# Patient Record
Sex: Female | Born: 1990 | Race: Black or African American | Hispanic: No | Marital: Single | Smoking: Never smoker
Health system: Southern US, Community
[De-identification: ages and names within clinical notes are randomized; demographics above are authoritative.]

## PROBLEM LIST (undated history)

## (undated) ENCOUNTER — Emergency Department (HOSPITAL_BASED_OUTPATIENT_CLINIC_OR_DEPARTMENT_OTHER): Admission: EM | Payer: Medicaid Other | Source: Home / Self Care

## (undated) DIAGNOSIS — D571 Sickle-cell disease without crisis: Secondary | ICD-10-CM

## (undated) HISTORY — PX: INDUCED ABORTION: SHX677

## (undated) HISTORY — PX: WISDOM TOOTH EXTRACTION: SHX21

---
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
## 2013-05-14 ENCOUNTER — Emergency Department (HOSPITAL_BASED_OUTPATIENT_CLINIC_OR_DEPARTMENT_OTHER)
Admission: EM | Admit: 2013-05-14 | Discharge: 2013-05-14 | Disposition: A | Discharge status: Home / Self Care | Payer: Medicaid Other | Attending: Emergency Medicine | Admitting: Emergency Medicine

## 2013-05-14 ENCOUNTER — Emergency Department (HOSPITAL_BASED_OUTPATIENT_CLINIC_OR_DEPARTMENT_OTHER): Payer: Medicaid Other

## 2013-05-14 ENCOUNTER — Encounter (HOSPITAL_BASED_OUTPATIENT_CLINIC_OR_DEPARTMENT_OTHER): Payer: Self-pay | Admitting: Student

## 2013-05-14 DIAGNOSIS — J45901 Unspecified asthma with (acute) exacerbation: Secondary | ICD-10-CM | POA: Insufficient documentation

## 2013-05-14 DIAGNOSIS — R079 Chest pain, unspecified: Secondary | ICD-10-CM | POA: Insufficient documentation

## 2013-05-14 MED ORDER — ALBUTEROL SULFATE HFA 108 (90 BASE) MCG/ACT IN AERS
2.0000 | INHALATION_SPRAY | RESPIRATORY_TRACT | Status: DC | PRN
  Administered 2013-05-14: 2 via RESPIRATORY_TRACT
  Filled 2013-05-14: qty 6.7

## 2013-05-14 NOTE — ED Provider Notes (Signed)
CSN: [id number]     Arrival date & time 05/14/13  0818 History   First MD Initiated Contact with Patient 05/14/13 0831     Chief Complaint  Patient presents with  . Chest Pain   (Consider location/radiation/quality/duration/timing/severity/associated sxs/prior Treatment) HPI Comments: Patient presents with complaints of chest pain. She states that her asthma starts flaring up yesterday he. Her chest pain started shortly after that. She has an aching in the Center of her chest. Otherwise nonradiating. It hurts to cough and to move and breathe deeply. She denies any leg pain or swelling. She denies any productive cough. She denies any rhinorrhea. There's no fevers, nausea vomiting or diaphoresis. She has no palpitations. She does have a history of asthma but she's been out of her inhaler for a long time she says because she hasn't really had problems with her asthma. There's no family history of sudden cardiac death per the patient.  Patient is a 22 y.o. female presenting with chest pain.  Chest Pain Associated symptoms: cough and shortness of breath   Associated symptoms: no abdominal pain, no back pain, no diaphoresis, no dizziness, no fatigue, no fever, no headache, no nausea, no numbness, not vomiting and no weakness     Past Medical History  Diagnosis Date  . Asthma    History reviewed. No pertinent past surgical history. No family history on file. History  Substance Use Topics  . Smoking status: Never Smoker   . Smokeless tobacco: Not on file  . Alcohol Use: No   OB History   Grav Para Term Preterm Abortions TAB SAB Ect Mult Living                 Review of Systems  Constitutional: Negative for fever, chills, diaphoresis and fatigue.  HENT: Negative for congestion, rhinorrhea and sneezing.   Eyes: Negative.   Respiratory: Positive for cough, shortness of breath and wheezing. Negative for chest tightness.   Cardiovascular: Positive for chest pain. Negative for leg swelling.   Gastrointestinal: Negative for nausea, vomiting, abdominal pain, diarrhea and blood in stool.  Genitourinary: Negative for frequency, hematuria, flank pain and difficulty urinating.  Musculoskeletal: Negative for back pain and arthralgias.  Skin: Negative for rash.  Neurological: Negative for dizziness, speech difficulty, weakness, numbness and headaches.    Allergies  Review of patient's allergies indicates no known allergies.  Home Medications  No current outpatient prescriptions on file. BP 106/46  Pulse 76  Temp(Src) 98.5 F (36.9 C) (Oral)  Resp 20  Wt 95 lb (43.092 kg)  BMI 18.55 kg/m2  SpO2 100% Physical Exam  Constitutional: She is oriented to person, place, and time. She appears well-developed and well-nourished.  HENT:  Head: Normocephalic and atraumatic.  Mouth/Throat: Oropharynx is clear and moist.  Eyes: Pupils are equal, round, and reactive to light.  Neck: Normal range of motion. Neck supple.  Cardiovascular: Normal rate, regular rhythm and normal heart sounds.   Pulmonary/Chest: Effort normal and breath sounds normal. No respiratory distress. She has no wheezes. She has no rales. She exhibits tenderness (reproducible tenderness over the sternum).  Abdominal: Soft. Bowel sounds are normal. There is no tenderness. There is no rebound and no guarding.  Musculoskeletal: Normal range of motion. She exhibits no edema.  No calf edema  Lymphadenopathy:    She has no cervical adenopathy.  Neurological: She is alert and oriented to person, place, and time.  Skin: Skin is warm and dry. No rash noted.  Psychiatric: She has a normal  mood and affect.    ED Course  Procedures (including critical care time) Labs Review Labs Reviewed - No data to display Imaging Review Dg Chest 2 View  05/14/2013   *RADIOLOGY REPORT*  Clinical Data: Chest pain, shortness of breath.  CHEST - 2 VIEW  Comparison: None.  Findings: Cardiomediastinal silhouette appears normal.  No acute  pulmonary disease is noted.  Bony thorax is intact.  IMPRESSION: No acute cardiopulmonary abnormality seen.   Original Report Authenticated By: Lupita Raider.,  M.D.     Date: 05/14/2013  Rate: 74  Rhythm: normal sinus rhythm  QRS Axis: normal  Intervals: normal  ST/T Wave abnormalities: normal  Conduction Disutrbances:none  Narrative Interpretation:   Old EKG Reviewed: none available  Dg Chest 2 View  05/14/2013   *RADIOLOGY REPORT*  Clinical Data: Chest pain, shortness of breath.  CHEST - 2 VIEW  Comparison: None.  Findings: Cardiomediastinal silhouette appears normal.  No acute pulmonary disease is noted.  Bony thorax is intact.  IMPRESSION: No acute cardiopulmonary abnormality seen.   Original Report Authenticated By: Lupita Raider.,  M.D.      MDM   1. Chest pain     Patient has centralized chest pain. It's worse with palpation the chest wall. She currently has new shortness of breath, persistent tachycardia or other symptoms concerning for PE. There is no evidence of pneumonia or pneumothorax. She was discharged home in good condition. She was dispensed an albuterol MDI to use at home. She was advised to followup with her primary care physician for reevaluation within a week.    Rolan Bucco, MD 05/14/13 [phone]

## 2013-05-14 NOTE — ED Notes (Signed)
Pt reports central CP s/p asthma attacks x 2 days. No medication at home.

## 2013-08-13 ENCOUNTER — Emergency Department (HOSPITAL_BASED_OUTPATIENT_CLINIC_OR_DEPARTMENT_OTHER)
Admission: EM | Admit: 2013-08-13 | Discharge: 2013-08-13 | Disposition: A | Discharge status: Home / Self Care | Payer: Medicaid Other | Attending: Emergency Medicine | Admitting: Emergency Medicine

## 2013-08-13 ENCOUNTER — Encounter (HOSPITAL_BASED_OUTPATIENT_CLINIC_OR_DEPARTMENT_OTHER): Payer: Self-pay | Admitting: Emergency Medicine

## 2013-08-13 DIAGNOSIS — M79645 Pain in left finger(s): Secondary | ICD-10-CM

## 2013-08-13 DIAGNOSIS — S6980XA Other specified injuries of unspecified wrist, hand and finger(s), initial encounter: Secondary | ICD-10-CM | POA: Insufficient documentation

## 2013-08-13 DIAGNOSIS — J45909 Unspecified asthma, uncomplicated: Secondary | ICD-10-CM | POA: Insufficient documentation

## 2013-08-13 DIAGNOSIS — X58XXXA Exposure to other specified factors, initial encounter: Secondary | ICD-10-CM | POA: Insufficient documentation

## 2013-08-13 DIAGNOSIS — Y939 Activity, unspecified: Secondary | ICD-10-CM | POA: Insufficient documentation

## 2013-08-13 DIAGNOSIS — Y929 Unspecified place or not applicable: Secondary | ICD-10-CM | POA: Insufficient documentation

## 2013-08-13 DIAGNOSIS — Z79899 Other long term (current) drug therapy: Secondary | ICD-10-CM | POA: Insufficient documentation

## 2013-08-13 DIAGNOSIS — S6990XA Unspecified injury of unspecified wrist, hand and finger(s), initial encounter: Secondary | ICD-10-CM | POA: Insufficient documentation

## 2013-08-13 MED ORDER — ACETAMINOPHEN 325 MG PO TABS
650.0000 mg | ORAL_TABLET | Freq: Once | ORAL | Status: AC
  Administered 2013-08-13: 650 mg via ORAL

## 2013-08-13 MED ORDER — ACETAMINOPHEN 325 MG PO TABS
ORAL_TABLET | ORAL | Status: AC
  Filled 2013-08-13: qty 2

## 2013-08-13 NOTE — ED Notes (Signed)
Fingernail broke off left ring finger, pt wants to make sure there is no  infection, no bleeding

## 2013-08-13 NOTE — ED Provider Notes (Signed)
CSN: [id number]     Arrival date & time 08/13/13  0229 History   First MD Initiated Contact with Patient 08/13/13 [phone]     Chief Complaint  Patient presents with  . Finger Injury   (Consider location/radiation/quality/duration/timing/severity/associated sxs/prior Treatment) Patient is a 22 y.o. female presenting with hand pain. The history is provided by the patient. No language interpreter was used.  Hand Pain This is a new problem. The current episode started 3 to 5 hours ago. The problem occurs constantly. The problem has not changed since onset.Nothing aggravates the symptoms. Nothing relieves the symptoms. She has tried nothing for the symptoms. The treatment provided no relief.  Broke off an acrylic finger nail tip that was on the left ring finger approximately 3 hours ago.  Patient wanted to be checked for infection at the site.    Past Medical History  Diagnosis Date  . Asthma    History reviewed. No pertinent past surgical history. History reviewed. No pertinent family history. History  Substance Use Topics  . Smoking status: Never Smoker   . Smokeless tobacco: Not on file  . Alcohol Use: No   OB History   Grav Para Term Preterm Abortions TAB SAB Ect Mult Living                 Review of Systems  All other systems reviewed and are negative.    Allergies  Review of patient's allergies indicates no known allergies.  Home Medications   Current Outpatient Rx  Name  Route  Sig  Dispense  Refill  . albuterol (PROVENTIL HFA;VENTOLIN HFA) 108 (90 BASE) MCG/ACT inhaler   Inhalation   Inhale into the lungs every 6 (six) hours as needed for wheezing or shortness of breath.          BP 132/76  Pulse 78  Temp(Src) 99.2 F (37.3 C) (Oral)  Resp 16  Ht 5' (1.524 m)  Wt 96 lb (43.545 kg)  BMI 18.75 kg/m2  SpO2 100% Physical Exam  Constitutional: She is oriented to person, place, and time. She appears well-developed and well-nourished. No distress.  HENT:  Head:  Normocephalic and atraumatic.  Eyes: EOM are normal.  Neck: Normal range of motion. Neck supple.  Cardiovascular: Normal rate, regular rhythm and intact distal pulses.   Pulmonary/Chest: Effort normal and breath sounds normal.  Abdominal: Soft. Bowel sounds are normal. There is no tenderness.  Musculoskeletal: Normal range of motion.       Hands: Broken off, no nailbed injury no swelling no paronychia  Neurological: She is alert and oriented to person, place, and time.  Skin: Skin is warm and dry.  Psychiatric: She has a normal mood and affect.    ED Course  Procedures (including critical care time) Labs Review Labs Reviewed - No data to display Imaging Review No results found.  EKG Interpretation   None       MDM  No diagnosis found. bandaid placed.  Patient counseled to keep the area clean and dry.  Apply neosporin and cover and take tylenol for pain.  Patient given first dose of tylenol in the ED   Jery Hollern K Jaiceon Collister-Rasch, MD 08/13/13 [phone]

## 2013-09-16 ENCOUNTER — Encounter (HOSPITAL_BASED_OUTPATIENT_CLINIC_OR_DEPARTMENT_OTHER): Payer: Self-pay | Admitting: Emergency Medicine

## 2013-09-16 ENCOUNTER — Emergency Department (HOSPITAL_BASED_OUTPATIENT_CLINIC_OR_DEPARTMENT_OTHER)
Admission: EM | Admit: 2013-09-16 | Discharge: 2013-09-16 | Disposition: A | Discharge status: Home / Self Care | Payer: Medicaid Other | Attending: Emergency Medicine | Admitting: Emergency Medicine

## 2013-09-16 ENCOUNTER — Emergency Department (HOSPITAL_BASED_OUTPATIENT_CLINIC_OR_DEPARTMENT_OTHER): Payer: Medicaid Other

## 2013-09-16 DIAGNOSIS — Z79899 Other long term (current) drug therapy: Secondary | ICD-10-CM | POA: Insufficient documentation

## 2013-09-16 DIAGNOSIS — J45909 Unspecified asthma, uncomplicated: Secondary | ICD-10-CM | POA: Insufficient documentation

## 2013-09-16 DIAGNOSIS — S63509A Unspecified sprain of unspecified wrist, initial encounter: Secondary | ICD-10-CM | POA: Insufficient documentation

## 2013-09-16 DIAGNOSIS — X500XXA Overexertion from strenuous movement or load, initial encounter: Secondary | ICD-10-CM | POA: Insufficient documentation

## 2013-09-16 DIAGNOSIS — Y9389 Activity, other specified: Secondary | ICD-10-CM | POA: Insufficient documentation

## 2013-09-16 DIAGNOSIS — S63502A Unspecified sprain of left wrist, initial encounter: Secondary | ICD-10-CM

## 2013-09-16 DIAGNOSIS — Y929 Unspecified place or not applicable: Secondary | ICD-10-CM | POA: Insufficient documentation

## 2013-09-16 MED ORDER — NAPROXEN 250 MG PO TABS
500.0000 mg | ORAL_TABLET | Freq: Once | ORAL | Status: AC
  Administered 2013-09-16: 500 mg via ORAL
  Filled 2013-09-16: qty 2

## 2013-09-16 MED ORDER — NAPROXEN 500 MG PO TABS: 500.0000 mg | ORAL_TABLET | Freq: Two times a day (BID) | ORAL | Status: DC

## 2013-09-16 NOTE — ED Notes (Signed)
2 hours ago began having pain in left wrist and forearm area thinks she may have hit it on something but doesn't know for sure

## 2013-09-16 NOTE — Discharge Instructions (Signed)
Sprain A sprain is an injury to the soft tissue that connects adjacent bones across a joint (ligament), in which the ligament becomes stretched or torn. The purpose of ligaments is to prevent a joint from moving out side of its intended range of motion. The most common joints of the body to suffer from a sprain are the ankles, knees, and fingers. Sprains are classified into 3 categories: grade 1, grade 2, and grade 3. Grade 1 sprains cause pain, but the tendon is not lengthened. Grade 2 sprains include a lengthened ligament due to the ligament being stretched or partially ruptured. With grade 2 sprains there is still function, although the function may be diminished. Grade 3 sprains are marked by a complete tear of the ligament and the joint usually displays a loss of function.  SYMPTOMS   Pain and tenderness in the area of injury; severity varies with extent of injury.  Swelling of the affected joint (usually).  Redness or bruising in the area of injury, either immediately or several hours after the injury.  Loss of normal mobility of the injured joint. CAUSES  A sprain may occur as a secondary injury to a traumatic event, such as a fall or twisting injury. The ankle is susceptible to sprains because of it is a mechanically weak joint and is exposed during athletic events. RISK INCREASES WITH:  Trauma, especially with high-risk activities, such as sports with a lot of jumping, for knee and ankle sprains (basketball or volleyball); sports with a lot of pivoting motions, for knee sprains (skiing, soccer, or football); and contact sports.  Falls onto outstretched hands and wrists (wrist sprains).  Catching sports, such as water polo and baseball (finger sprains).  Poorly fitting and high-heeled shoes.  Poor field conditions.  Poor strength and flexibility.  Failure to warm-up properly before activity. PREVENTION  Warm up and stretch properly before activity.  Maintain physical  fitness:  Muscle strength.  Endurance and flexibility.  Cardiovascular fitness.  Wear properly fitted and padded protective equipment.  Wrap weak joints with support bandages before strenuous activity. PROGNOSIS  If treated properly, sprains usually heal in 2 to 8 weeks. Occasionally sprains require surgery for healing to occur. RELATED COMPLICATIONS  Permanent instability of a joint if the sprain is severe or if a ligament is repeatedly sprained.  Arthritis of the joint. TREATMENT Treatment involves ice and medicine to relieve pain and inflammation. Rest and immobilization of the injured joint is necessary for healing to occur. Strengthening and stretching exercises may be recommended after immobilization to regain strength and a full range of motion. For severe sprains surgery may be necessary to repair the injured ligament. MEDICATION  If pain medicine is necessary, then nonsteroidal anti-inflammatory medicines, such as aspirin and ibuprofen, or other minor pain relievers, such as acetaminophen, are often recommended.  Do not take pain medicine for 7 days before surgery.  Prescription pain relievers may be prescribed. Use only as directed and only as much as you need.  Cortisone injections are generally not advised for sprains. Cortisone may affect the healing of the ligament. HEAT AND COLD  Cold treatment (icing) relieves pain and reduces inflammation. Cold treatment should be applied for 10 to 15 minutes every 2 to 3 hours for inflammation and pain and immediately after any activity that aggravates your symptoms. Use ice packs or massage the area with a piece of ice (ice massage).  Heat treatment may be used prior to performing the stretching and strengthening activities prescribed by your   caregiver, physical therapist, or athletic trainer. Use a heat pack or soak the injury in warm water. SEEK MEDICAL CARE IF:  Symptoms get worse or do not improve in 2 to 6 weeks despite  treatment. Document Released: 08/21/2005 Document Revised: 11/13/2011 Document Reviewed: 12/03/2008 [employer] Patient Information 2014 [employer].  

## 2013-09-16 NOTE — ED Provider Notes (Signed)
CSN: [id number]     Arrival date & time 09/16/13  1635 History   First MD Initiated Contact with Patient 09/16/13 1659     Chief Complaint  Patient presents with  . wrist and forearm pain     HPI Patient states she was moving some objects out of the car and she started having pain in her left wrist. She's not sure she hit it on something or if she injured it. Since that time she's had persistent pain in the left wrist. It increases with movement and palpation. She denies any numbness or weakness. She denies any other injuries. She generally does not have any pain or discomfort in her left wrist.  No elbow pain. No pain in her hand or fingers. Past Medical History  Diagnosis Date  . Asthma    History reviewed. No pertinent past surgical history. History reviewed. No pertinent family history. History  Substance Use Topics  . Smoking status: Never Smoker   . Smokeless tobacco: Not on file  . Alcohol Use: No   OB History   Grav Para Term Preterm Abortions TAB SAB Ect Mult Living                 Review of Systems  All other systems reviewed and are negative.    Allergies  Review of patient's allergies indicates no known allergies.  Home Medications   Current Outpatient Rx  Name  Route  Sig  Dispense  Refill  . albuterol (PROVENTIL HFA;VENTOLIN HFA) 108 (90 BASE) MCG/ACT inhaler   Inhalation   Inhale into the lungs every 6 (six) hours as needed for wheezing or shortness of breath.         . naproxen (NAPROSYN) 500 MG tablet   Oral   Take 1 tablet (500 mg total) by mouth 2 (two) times daily.   30 tablet   0    BP 125/73  Temp(Src) 98.5 F (36.9 C)  Resp 18  Ht 5' (1.524 m)  Wt 97 lb (43.999 kg)  BMI 18.94 kg/m2  SpO2 99% Physical Exam  Nursing note and vitals reviewed. Constitutional: She appears well-developed and well-nourished. No distress.  HENT:  Head: Normocephalic and atraumatic.  Right Ear: External ear normal.  Left Ear: External ear normal.   Eyes: Conjunctivae are normal. Right eye exhibits no discharge. Left eye exhibits no discharge. No scleral icterus.  Neck: Neck supple. No tracheal deviation present.  Cardiovascular: Normal rate.   Pulmonary/Chest: Effort normal. No stridor. No respiratory distress.  Musculoskeletal: She exhibits tenderness. She exhibits no edema.       Left elbow: Normal.       Left wrist: She exhibits tenderness and bony tenderness. She exhibits no swelling, no effusion, no crepitus and no deformity.       Left forearm: Normal.       Left hand: Normal.  Neurological: She is alert. Cranial nerve deficit: no gross deficits.  Skin: Skin is warm and dry. No rash noted.  Psychiatric: She has a normal mood and affect.    ED Course  Procedures (including critical care time) Labs Review Labs Reviewed - No data to display Imaging Review Dg Wrist Complete Left  09/16/2013   CLINICAL DATA:  Wrist pain for 2 hr  EXAM: LEFT WRIST - COMPLETE 3+ VIEW  COMPARISON:  None.  FINDINGS: There is no evidence of fracture or dislocation. There is no evidence of arthropathy or other focal bone abnormality. Soft tissues are unremarkable.  IMPRESSION: No  acute abnormality noted.   Electronically Signed   By: Alcide CleverMark  Lukens M.D.   On: 09/16/2013 17:28    EKG Interpretation   None       MDM   1. Left wrist sprain    No sign of infection on exam.  Normal pulses.  Xrays normal.  Possible ligamentous injury.  Rest, ice , splint,  Nsaids.  Follow up ortho prn    Celene KrasJon R Kenneith Stief, MD 09/16/13 [phone]

## 2014-01-07 ENCOUNTER — Emergency Department (HOSPITAL_BASED_OUTPATIENT_CLINIC_OR_DEPARTMENT_OTHER)
Admission: EM | Admit: 2014-01-07 | Discharge: 2014-01-07 | Disposition: A | Discharge status: Home / Self Care | Payer: Medicaid Other | Attending: Emergency Medicine | Admitting: Emergency Medicine

## 2014-01-07 ENCOUNTER — Encounter (HOSPITAL_BASED_OUTPATIENT_CLINIC_OR_DEPARTMENT_OTHER): Payer: Self-pay | Admitting: Emergency Medicine

## 2014-01-07 ENCOUNTER — Emergency Department (HOSPITAL_BASED_OUTPATIENT_CLINIC_OR_DEPARTMENT_OTHER): Payer: Medicaid Other

## 2014-01-07 DIAGNOSIS — IMO0002 Reserved for concepts with insufficient information to code with codable children: Secondary | ICD-10-CM | POA: Insufficient documentation

## 2014-01-07 DIAGNOSIS — J45909 Unspecified asthma, uncomplicated: Secondary | ICD-10-CM | POA: Insufficient documentation

## 2014-01-07 DIAGNOSIS — Y929 Unspecified place or not applicable: Secondary | ICD-10-CM | POA: Insufficient documentation

## 2014-01-07 DIAGNOSIS — Y9301 Activity, walking, marching and hiking: Secondary | ICD-10-CM | POA: Insufficient documentation

## 2014-01-07 DIAGNOSIS — S90129A Contusion of unspecified lesser toe(s) without damage to nail, initial encounter: Secondary | ICD-10-CM

## 2014-01-07 DIAGNOSIS — Z79899 Other long term (current) drug therapy: Secondary | ICD-10-CM | POA: Insufficient documentation

## 2014-01-07 MED ORDER — NAPROXEN 500 MG PO TABS
500.0000 mg | ORAL_TABLET | Freq: Two times a day (BID) | ORAL | Status: DC
Start: 2014-01-07 — End: 2014-03-21

## 2014-01-07 NOTE — ED Notes (Signed)
Pain and discoloration to left 2nd toe today while walking.  No known injury.

## 2014-01-07 NOTE — Discharge Instructions (Signed)

## 2014-01-07 NOTE — ED Provider Notes (Signed)
CSN: [id number]     Arrival date & time 01/07/14  1750 History  This chart was scribed for Alexis Peters Mekala Winger, MD by Beverly MilchJ Harrison Collins, ED Scribe. This patient was seen in room MH05/[facility]05 and the patient's care was started at 6:18 PM.    Chief Complaint  Patient presents with  . Toe Pain    The history is provided by the patient. No language interpreter was used.  HPI Comments: Alexis Peters is a 23 y.o. female who presents to the Emergency Department complaining of gradual onset left toe pain that began today about four hours ago while she was walking. Pt denies any kind of trauma to left foot. She states she had a surgery yesterday for her wisdom tooth and is unsure if perhaps she hit it without knowing while on pain medication. Pt denies taking any medicine for her toe pain. She states her pain worsens with movement of her toes and walking. Pt denies fever, chills, nausea, vomiting, and diarrhea.  Past Medical History  Diagnosis Date  . Asthma     Past Surgical History  Procedure Laterality Date  . Wisdom tooth extraction      No family history on file. History  Substance Use Topics  . Smoking status: Never Smoker   . Smokeless tobacco: Not on file  . Alcohol Use: No    Review of Systems  Musculoskeletal: Positive for arthralgias.  A complete 10 system review of systems was obtained and all systems are negative except as noted in the HPI and PMH.    Allergies  Review of patient's allergies indicates no known allergies.   Home Medications   Prior to Admission medications   Medication Sig Start Date End Date Taking? Authorizing Provider  albuterol (PROVENTIL HFA;VENTOLIN HFA) 108 (90 BASE) MCG/ACT inhaler Inhale into the lungs every 6 (six) hours as needed for wheezing or shortness of breath.    Historical Provider, MD  naproxen (NAPROSYN) 500 MG tablet Take 1 tablet (500 mg total) by mouth 2 (two) times daily. 09/16/13   Alexis Peters Alexis Dossantos, MD    Triage Vitals: BP 122/71  Pulse 90   Temp(Src) 99.3 F (37.4 C) (Oral)  Resp 16  Ht 5' (1.524 m)  Wt 97 lb (43.999 kg)  BMI 18.94 kg/m2   Physical Exam  Nursing note and vitals reviewed. Constitutional: She appears well-developed and well-nourished. No distress.  HENT:  Head: Normocephalic and atraumatic.  Right Ear: External ear normal.  Left Ear: External ear normal.  Eyes: Conjunctivae are normal. Right eye exhibits no discharge. Left eye exhibits no discharge. No scleral icterus.  Neck: Neck supple. No tracheal deviation present.  Cardiovascular: Normal rate.   Pulmonary/Chest: Effort normal. No stridor. No respiratory distress.  Musculoskeletal: She exhibits tenderness (Mild tenderness to palpation of distal DIP joint of 2nd toe, some ecchymosis, no subungual hematoma, no deformity). She exhibits no edema.  Neurological: She is alert. Cranial nerve deficit: no gross deficits.  Skin: Skin is warm and dry. No rash noted.  Psychiatric: She has a normal mood and affect.    ED Course  Procedures (including critical care time)   COORDINATION OF CARE: 6:20 PM- Pt advised of plan for treatment and pt agrees.     Imaging Review Dg Toe 2nd Left  01/07/2014   CLINICAL DATA:  Pain and bruising.  EXAM: LEFT SECOND TOE  COMPARISON:  None.  FINDINGS: The joint spaces are maintained.  No acute fracture is identified.  IMPRESSION: No acute bony  findings.   Electronically Signed   By: Loralie ChampagneMark  Gallerani M.D.   On: 01/07/2014 18:34      MDM   Final diagnoses:  Bruised toe    Exam is most consistent with a contusion.  Doubt infection.  Nl cap refill, no cyanosis.  Pt does not recall injury but she did have recent dental surgery.  It is possible she does not remember stubbing her toe.   Alexis Peters Yasir Kitner, MD 01/07/14 [phone]

## 2014-03-21 ENCOUNTER — Encounter (HOSPITAL_BASED_OUTPATIENT_CLINIC_OR_DEPARTMENT_OTHER): Payer: Self-pay | Admitting: Emergency Medicine

## 2014-03-21 ENCOUNTER — Emergency Department (HOSPITAL_BASED_OUTPATIENT_CLINIC_OR_DEPARTMENT_OTHER)
Admission: EM | Admit: 2014-03-21 | Discharge: 2014-03-21 | Disposition: A | Discharge status: Home / Self Care | Payer: Medicaid Other | Attending: Emergency Medicine | Admitting: Emergency Medicine

## 2014-03-21 DIAGNOSIS — R1013 Epigastric pain: Secondary | ICD-10-CM

## 2014-03-21 DIAGNOSIS — J45909 Unspecified asthma, uncomplicated: Secondary | ICD-10-CM | POA: Insufficient documentation

## 2014-03-21 DIAGNOSIS — K3189 Other diseases of stomach and duodenum: Secondary | ICD-10-CM | POA: Insufficient documentation

## 2014-03-21 DIAGNOSIS — Z79899 Other long term (current) drug therapy: Secondary | ICD-10-CM | POA: Insufficient documentation

## 2014-03-21 LAB — COMPREHENSIVE METABOLIC PANEL
ALBUMIN: 4.3 g/dL (ref 3.5–5.2)
ALK PHOS: 72 U/L (ref 39–117)
ALT: 5 U/L (ref 0–35)
ANION GAP: 14 (ref 5–15)
AST: 15 U/L (ref 0–37)
BILIRUBIN TOTAL: 0.6 mg/dL (ref 0.3–1.2)
BUN: 6 mg/dL (ref 6–23)
CHLORIDE: 106 meq/L (ref 96–112)
CO2: 22 mEq/L (ref 19–32)
Calcium: 9.6 mg/dL (ref 8.4–10.5)
Creatinine, Ser: 1 mg/dL (ref 0.50–1.10)
GFR calc non Af Amer: 80 mL/min — ABNORMAL LOW (ref >90)
GLUCOSE: 69 mg/dL — AB (ref 70–99)
POTASSIUM: 3.6 meq/L — AB (ref 3.7–5.3)
SODIUM: 142 meq/L (ref 137–147)
Total Protein: 7.4 g/dL (ref 6.0–8.3)

## 2014-03-21 LAB — CBC WITH DIFFERENTIAL/PLATELET
Basophils Absolute: 0.1 10*3/uL (ref 0.0–0.1)
Basophils Relative: 1 % (ref 0–1)
Eosinophils Absolute: 0 10*3/uL (ref 0.0–0.7)
Eosinophils Relative: 0 % (ref 0–5)
HCT: 39.3 % (ref 36.0–46.0)
HEMOGLOBIN: 13.9 g/dL (ref 12.0–15.0)
LYMPHS ABS: 2.3 10*3/uL (ref 0.7–4.0)
Lymphocytes Relative: 26 % (ref 12–46)
MCH: 28 pg (ref 26.0–34.0)
MCHC: 35.4 g/dL (ref 30.0–36.0)
MCV: 79.2 fL (ref 78.0–100.0)
MONOS PCT: 8 % (ref 3–12)
Monocytes Absolute: 0.7 10*3/uL (ref 0.1–1.0)
NEUTROS ABS: 5.7 10*3/uL (ref 1.7–7.7)
NEUTROS PCT: 66 % (ref 43–77)
PLATELETS: 288 10*3/uL (ref 150–400)
RBC: 4.96 MIL/uL (ref 3.87–5.11)
RDW: 12.1 % (ref 11.5–15.5)
WBC: 8.7 10*3/uL (ref 4.0–10.5)

## 2014-03-21 LAB — LIPASE, BLOOD: Lipase: 20 U/L (ref 11–59)

## 2014-03-21 MED ORDER — OMEPRAZOLE 20 MG PO CPDR: 20.0000 mg | DELAYED_RELEASE_CAPSULE | Freq: Every day | ORAL | Status: DC

## 2014-03-21 MED ORDER — GI COCKTAIL ~~LOC~~
30.0000 mL | Freq: Once | ORAL | Status: AC
  Administered 2014-03-21: 30 mL via ORAL
  Filled 2014-03-21: qty 30

## 2014-03-21 NOTE — ED Provider Notes (Signed)
CSN: [id number]     Arrival date & time 03/21/14  1638 History   First MD Initiated Contact with Patient 03/21/14 1728     Chief Complaint  Patient presents with  . Nausea  . Abscess     (Consider location/radiation/quality/duration/timing/severity/associated sxs/prior Treatment) Patient is a 23 y.o. female presenting with abdominal pain. The history is provided by the patient. No language interpreter was used.  Abdominal Pain Pain location:  Generalized Associated symptoms: no chest pain, no constipation, no fever, no nausea, no shortness of breath and no vomiting   Associated symptoms comment:  Generalized abdominal pain that started yesterday and kept her up through the night. No nausea or vomiting. No fever. She denies constipation or blood per rectum. She denies regular use of NSAID medications.    Past Medical History  Diagnosis Date  . Asthma    Past Surgical History  Procedure Laterality Date  . Wisdom tooth extraction     No family history on file. History  Substance Use Topics  . Smoking status: Never Smoker   . Smokeless tobacco: Not on file  . Alcohol Use: No   OB History   Grav Para Term Preterm Abortions TAB SAB Ect Mult Living                 Review of Systems  Constitutional: Negative for fever.  Respiratory: Negative for shortness of breath.   Cardiovascular: Negative for chest pain.  Gastrointestinal: Positive for abdominal pain. Negative for nausea, vomiting, constipation and blood in stool.  Musculoskeletal: Negative for myalgias.  Skin:       She complains of a painless knot where she received a Depo injection previously.      Allergies  Review of patient's allergies indicates no known allergies.  Home Medications   Prior to Admission medications   Medication Sig Start Date End Date Taking? Authorizing Provider  albuterol (PROVENTIL HFA;VENTOLIN HFA) 108 (90 BASE) MCG/ACT inhaler Inhale into the lungs every 6 (six) hours as needed for  wheezing or shortness of breath.    Historical Provider, MD   BP 130/70  Pulse 88  Temp(Src) 98.5 F (36.9 C) (Oral)  Resp 18  Ht 5' (1.524 m)  Wt 95 lb (43.092 kg)  BMI 18.55 kg/m2  SpO2 100% Physical Exam  Constitutional: She appears well-developed and well-nourished. No distress.  Abdominal: Soft.  Epigastric tenderness without guarding. Soft, nondistended abdomen with normoactive bowel sounds.   Skin: Skin is warm and dry.  Palpable, non-tender, mobile and firm nodule in left buttock at reported previous injection site.     ED Course  Procedures (including critical care time) Labs Review Labs Reviewed  COMPREHENSIVE METABOLIC PANEL - Abnormal; Notable for the following:    Potassium 3.6 (*)    Glucose, Bld 69 (*)    GFR calc non Af Amer 80 (*)    All other components within normal limits  CBC WITH DIFFERENTIAL  LIPASE, BLOOD    Imaging Review No results found.   EKG Interpretation None      MDM   Final diagnoses:  None    1. Abdominal pain  No fever, no lab abnormalities and pain resolved with GI Cocktail - doubt acute process, suspect mild gastritis or dyspepsia. Recommend Prilosec. Nodule on left buttock likely scarring from previous infection. Doubt acute process as last injection was months ago.    Arnoldo HookerShari A Ysidra Sopher, PA-C 03/21/14 1920

## 2014-03-21 NOTE — Discharge Instructions (Signed)
Indigestion °Indigestion is discomfort in the upper abdomen that is caused by underlying problems such as gastroesophageal reflux disease (GERD), ulcers, or gallbladder problems.  °CAUSES  °Indigestion can be caused by many things. Possible causes include: °· Stomach acid in the esophagus. °· Stomach infections, usually caused by the bacteria H. pylori. °· Being overweight. °· Hiatal hernia. This means part of the stomach pushes up through the diaphragm. °· Overeating. °· Emotional problems, such as stress, anxiety, or depression. °· Poor nutrition. °· Consuming too much alcohol, tobacco, or caffeine. °· Consuming spicy foods, fats, peppermint, chocolate, tomato products, citrus, or fruit juices. °· Medicines such as aspirin and other anti-inflammatory drugs, hormones, steroids, and thyroid medicines. °· Gastroparesis. This is a condition in which the stomach does not empty properly. °· Stomach cancer. °· Pregnancy, due to an increase in hormone levels, a relaxation of muscles in the digestive tract, and pressure on the stomach from the growing fetus. °SYMPTOMS  °· Uncomfortable feeling of fullness after eating. °· Pain or burning sensation in the upper abdomen. °· Bloating. °· Belching and gas. °· Nausea and vomiting. °· Acidic taste in the mouth. °· Burning sensation in the chest (heartburn). °DIAGNOSIS  °Your caregiver will review your medical history and perform a physical exam. Other tests, such as blood tests, stool tests, X-rays, and other imaging scans, may be done to check for more serious problems. °TREATMENT  °Liquid antacids and other drugs may be given to block stomach acid secretion. Medicines that increase esophageal muscle tone may also be given to help reduce symptoms. If an infection is found, antibiotic medicine may be given. °HOME CARE INSTRUCTIONS °· Avoid foods and drinks that make your symptoms worse, such as: °¨ Caffeine or alcoholic drinks. °¨ Chocolate. °¨ Peppermint or mint  flavorings. °¨ Garlic and onions. °¨ Spicy foods. °¨ Citrus fruits, such as oranges, lemons, or limes. °¨ Tomato-based foods such as sauce, chili, salsa, and pizza. °¨ Fried and fatty foods. °· Avoid eating for the 3 hours prior to your bedtime. °· Eat small, frequent meals instead of large meals. °· Stop smoking if you smoke. °· Maintain a healthy weight. °· Wear loose-fitting clothing. Do not wear anything tight around your waist that causes pressure on your stomach. °· Raise the head of your bed 4 to 8 inches with wood blocks to help you sleep. Extra pillows will not help. °· Only take over-the-counter or prescription medicines as directed by your caregiver. °· Do not take aspirin, ibuprofen, or other nonsteroidal anti-inflammatory drugs (NSAIDs). °SEEK IMMEDIATE MEDICAL CARE IF:  °· You are not better after 2 days. °· You have chest pressure or pain that radiates up into your neck, arms, back, jaw, or upper abdomen. °· You have difficulty swallowing. °· You keep vomiting. °· You have black or bloody stools. °· You have a fever. °· You have dizziness, fainting, difficulty breathing, or heavy sweating. °· You have severe abdominal pain. °· You lose weight without trying. °MAKE SURE YOU: °· Understand these instructions. °· Will watch your condition. °· Will get help right away if you are not doing well or get worse. °Document Released: 09/28/2004 Document Revised: 11/13/2011 Document Reviewed: 04/05/2011 °ExitCare® Patient Information ©2015 [employer]. This information is not intended to replace advice given to you by your health care provider. Make sure you discuss any questions you have with your health care provider. ° °

## 2014-03-21 NOTE — ED Provider Notes (Signed)
Medical screening examination/treatment/procedure(s) were performed by non-physician practitioner and as supervising physician I was immediately available for consultation/collaboration.   EKG Interpretation None        Kristen N Ward, DO 03/21/14 2318 

## 2014-03-21 NOTE — ED Notes (Signed)
Reports nausea with no vomiting. Also sts abscess to left buttock

## 2014-07-26 ENCOUNTER — Encounter (HOSPITAL_BASED_OUTPATIENT_CLINIC_OR_DEPARTMENT_OTHER): Payer: Self-pay

## 2014-07-26 ENCOUNTER — Emergency Department (HOSPITAL_BASED_OUTPATIENT_CLINIC_OR_DEPARTMENT_OTHER)
Admission: EM | Admit: 2014-07-26 | Discharge: 2014-07-26 | Disposition: A | Discharge status: Home / Self Care | Payer: No Typology Code available for payment source | Attending: Emergency Medicine | Admitting: Emergency Medicine

## 2014-07-26 DIAGNOSIS — Y998 Other external cause status: Secondary | ICD-10-CM | POA: Insufficient documentation

## 2014-07-26 DIAGNOSIS — J45909 Unspecified asthma, uncomplicated: Secondary | ICD-10-CM | POA: Diagnosis not present

## 2014-07-26 DIAGNOSIS — Y9389 Activity, other specified: Secondary | ICD-10-CM | POA: Diagnosis not present

## 2014-07-26 DIAGNOSIS — Y92410 Unspecified street and highway as the place of occurrence of the external cause: Secondary | ICD-10-CM | POA: Insufficient documentation

## 2014-07-26 DIAGNOSIS — S0990XA Unspecified injury of head, initial encounter: Secondary | ICD-10-CM | POA: Diagnosis not present

## 2014-07-26 DIAGNOSIS — S6991XA Unspecified injury of right wrist, hand and finger(s), initial encounter: Secondary | ICD-10-CM | POA: Diagnosis not present

## 2014-07-26 DIAGNOSIS — Z79899 Other long term (current) drug therapy: Secondary | ICD-10-CM | POA: Diagnosis not present

## 2014-07-26 DIAGNOSIS — S3992XA Unspecified injury of lower back, initial encounter: Secondary | ICD-10-CM | POA: Diagnosis not present

## 2014-07-26 MED ORDER — METHOCARBAMOL 500 MG PO TABS: 500.0000 mg | ORAL_TABLET | Freq: Two times a day (BID) | ORAL | Status: DC

## 2014-07-26 MED ORDER — IBUPROFEN 400 MG PO TABS: 400.0000 mg | ORAL_TABLET | Freq: Four times a day (QID) | ORAL | Status: DC | PRN

## 2014-07-26 NOTE — Discharge Instructions (Signed)
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
## 2014-10-14 ENCOUNTER — Emergency Department (HOSPITAL_BASED_OUTPATIENT_CLINIC_OR_DEPARTMENT_OTHER)
Admission: EM | Admit: 2014-10-14 | Discharge: 2014-10-14 | Disposition: A | Discharge status: Home / Self Care | Payer: Medicaid Other | Attending: Emergency Medicine | Admitting: Emergency Medicine

## 2014-10-14 ENCOUNTER — Encounter (HOSPITAL_BASED_OUTPATIENT_CLINIC_OR_DEPARTMENT_OTHER): Payer: Self-pay | Admitting: Emergency Medicine

## 2014-10-14 DIAGNOSIS — Z79899 Other long term (current) drug therapy: Secondary | ICD-10-CM | POA: Insufficient documentation

## 2014-10-14 DIAGNOSIS — Z3202 Encounter for pregnancy test, result negative: Secondary | ICD-10-CM | POA: Insufficient documentation

## 2014-10-14 DIAGNOSIS — B9689 Other specified bacterial agents as the cause of diseases classified elsewhere: Secondary | ICD-10-CM

## 2014-10-14 DIAGNOSIS — J45909 Unspecified asthma, uncomplicated: Secondary | ICD-10-CM | POA: Insufficient documentation

## 2014-10-14 DIAGNOSIS — N760 Acute vaginitis: Secondary | ICD-10-CM | POA: Insufficient documentation

## 2014-10-14 LAB — URINALYSIS, ROUTINE W REFLEX MICROSCOPIC
Bilirubin Urine: NEGATIVE
Glucose, UA: NEGATIVE mg/dL
Hgb urine dipstick: NEGATIVE
Ketones, ur: NEGATIVE mg/dL
Leukocytes, UA: NEGATIVE
Nitrite: NEGATIVE
Protein, ur: NEGATIVE mg/dL
SPECIFIC GRAVITY, URINE: 1.012 (ref 1.005–1.030)
UROBILINOGEN UA: 1 mg/dL (ref 0.0–1.0)
pH: 6 (ref 5.0–8.0)

## 2014-10-14 LAB — WET PREP, GENITAL
TRICH WET PREP: NONE SEEN
YEAST WET PREP: NONE SEEN

## 2014-10-14 LAB — PREGNANCY, URINE: Preg Test, Ur: NEGATIVE

## 2014-10-14 MED ORDER — METRONIDAZOLE 500 MG PO TABS: 500.0000 mg | ORAL_TABLET | Freq: Two times a day (BID) | ORAL | Status: DC

## 2014-10-14 MED ORDER — METRONIDAZOLE 500 MG PO TABS
500.0000 mg | ORAL_TABLET | Freq: Once | ORAL | Status: AC
  Administered 2014-10-14: 500 mg via ORAL
  Filled 2014-10-14: qty 1

## 2014-10-14 NOTE — Discharge Instructions (Signed)
Bacterial Vaginosis Bacterial vaginosis is a vaginal infection that occurs when the normal balance of bacteria in the vagina is disrupted. It results from an overgrowth of certain bacteria. This is the most common vaginal infection in women of childbearing age. Treatment is important to prevent complications, especially in pregnant women, as it can cause a premature delivery. CAUSES  Bacterial vaginosis is caused by an increase in harmful bacteria that are normally present in smaller amounts in the vagina. Several different kinds of bacteria can cause bacterial vaginosis. However, the reason that the condition develops is not fully understood. RISK FACTORS Certain activities or behaviors can put you at an increased risk of developing bacterial vaginosis, including:  Having a new sex partner or multiple sex partners.  Douching.  Using an intrauterine device (IUD) for contraception. Women do not get bacterial vaginosis from toilet seats, bedding, swimming pools, or contact with objects around them. SIGNS AND SYMPTOMS  Some women with bacterial vaginosis have no signs or symptoms. Common symptoms include:  Grey vaginal discharge.  A fishlike odor with discharge, especially after sexual intercourse.  Itching or burning of the vagina and vulva.  Burning or pain with urination. DIAGNOSIS  Your health care provider will take a medical history and examine the vagina for signs of bacterial vaginosis. A sample of vaginal fluid may be taken. Your health care provider will look at this sample under a microscope to check for bacteria and abnormal cells. A vaginal pH test may also be done.  TREATMENT  Bacterial vaginosis may be treated with antibiotic medicines. These may be given in the form of a pill or a vaginal cream. A second round of antibiotics may be prescribed if the condition comes back after treatment.  HOME CARE INSTRUCTIONS   Only take over-the-counter or prescription medicines as  directed by your health care provider.  If antibiotic medicine was prescribed, take it as directed. Make sure you finish it even if you start to feel better.  Do not have sex until treatment is completed.  Tell all sexual partners that you have a vaginal infection. They should see their health care provider and be treated if they have problems, such as a mild rash or itching.  Practice safe sex by using condoms and only having one sex partner. SEEK MEDICAL CARE IF:   Your symptoms are not improving after 3 days of treatment.  You have increased discharge or pain.  You have a fever. MAKE SURE YOU:   Understand these instructions.  Will watch your condition.  Will get help right away if you are not doing well or get worse. FOR MORE INFORMATION  Centers for Disease Control and Prevention, Division of STD Prevention: www.cdc.gov/std American Sexual Health Association (ASHA): www.ashastd.org  Document Released: 08/21/2005 Document Revised: 06/11/2013 Document Reviewed: 04/02/2013 ExitCare Patient Information 2015 [employer]. This information is not intended to replace advice given to you by your health care provider. Make sure you discuss any questions you have with your health care provider.  

## 2014-10-14 NOTE — ED Notes (Signed)
Pt c/o thick vaginal discharge.

## 2014-10-14 NOTE — ED Provider Notes (Signed)
CSN: [id number]     Arrival date & time 10/14/14  2124 History  This chart was scribed for Alexis Camel, MD by Gwenyth Ober, ED Scribe. This patient was seen in room MH01/MH01 and the patient's care was started at 10:00 PM.    Chief Complaint  Patient presents with  . Vaginal Discharge   The history is provided by the patient. No language interpreter was used.    HPI Comments: Alexis Peters is a 24 y.o. female with a history of asthma who presents to the Emergency Department complaining of constant, gradually worsening malodorous vaginal discharge that started a few days ago. Smells "fishy". Pt states itchiness as an associated symptom. She also reports that she recently scratched the skin around her genitals causing mild pain with urination. Pt denies new sexual partners, condom use and a history of STIs. She also denies dysuria and lower back pain as associated symptoms.   Past Medical History  Diagnosis Date  . Asthma    Past Surgical History  Procedure Laterality Date  . Wisdom tooth extraction     No family history on file. History  Substance Use Topics  . Smoking status: Never Smoker   . Smokeless tobacco: Not on file  . Alcohol Use: No   OB History    No data available     Review of Systems  Gastrointestinal: Negative for abdominal pain.  Genitourinary: Positive for vaginal discharge. Negative for dysuria, hematuria, vaginal bleeding and menstrual problem.  Musculoskeletal: Negative for back pain.  All other systems reviewed and are negative.   Allergies  Review of patient's allergies indicates no known allergies.  Home Medications   Prior to Admission medications   Medication Sig Start Date End Date Taking? Authorizing Provider  albuterol (PROVENTIL HFA;VENTOLIN HFA) 108 (90 BASE) MCG/ACT inhaler Inhale into the lungs every 6 (six) hours as needed for wheezing or shortness of breath.   Yes Historical Provider, MD  ibuprofen (ADVIL,MOTRIN) 400 MG tablet Take  1 tablet (400 mg total) by mouth every 6 (six) hours as needed. 07/26/14   Fayrene Helper, PA-C  methocarbamol (ROBAXIN) 500 MG tablet Take 1 tablet (500 mg total) by mouth 2 (two) times daily. 07/26/14   Fayrene Helper, PA-C  omeprazole (PRILOSEC) 20 MG capsule Take 1 capsule (20 mg total) by mouth daily. 03/21/14   Shari A Upstill, PA-C   BP 104/74 mmHg  Pulse 60  Temp(Src) 98.2 F (36.8 C) (Oral)  Resp 16  Ht 5' (1.524 m)  Wt 97 lb (43.999 kg)  BMI 18.94 kg/m2  SpO2 100%  LMP 10/07/2014 Physical Exam  Constitutional: She appears well-developed and well-nourished. No distress.  HENT:  Head: Normocephalic and atraumatic.  Neck: No tracheal deviation present.  Cardiovascular: Normal rate, regular rhythm and normal heart sounds.   Pulmonary/Chest: Effort normal and breath sounds normal. No respiratory distress.  Abdominal: Soft. Bowel sounds are normal. She exhibits no distension. There is no tenderness.  No CVA tenderness  Genitourinary: Uterus is not enlarged and not tender. Cervix exhibits no motion tenderness and no friability. Right adnexum displays no mass and no tenderness. Left adnexum displays no mass and no tenderness. Vaginal discharge found.  Skin: Skin is warm and dry.  Psychiatric: She has a normal mood and affect. Her behavior is normal.  Nursing note and vitals reviewed.   ED Course  Procedures (including critical care time) DIAGNOSTIC STUDIES: Oxygen Saturation is 100% on RA, normal by my interpretation.    COORDINATION OF CARE:  10:03 PM Discussed treatment plan with pt which includes UA. She agreed to plan.   Labs Review Labs Reviewed  WET PREP, GENITAL - Abnormal; Notable for the following:    Clue Cells Wet Prep HPF POC MODERATE (*)    WBC, Wet Prep HPF POC FEW (*)    All other components within normal limits  PREGNANCY, URINE  URINALYSIS, ROUTINE W REFLEX MICROSCOPIC  GC/CHLAMYDIA PROBE AMP ([place])    Imaging Review No results found.   EKG  Interpretation None      MDM   Final diagnoses:  Bacterial vaginosis    Patient with symptomatic vaginal discharge consistent with bacterial vaginosis. Patient is not pregnant and does not evidence of a UTI. Given this I did recommend Rocephin/azithro to cover for STIs. She declines, stating "I know I don't have an STD". This is reasonable, will follow GC/Chlamydia and will need treatment if they become positive.   I personally performed the services described in this documentation, which was scribed in my presence. The recorded information has been reviewed and is accurate.    Alexis CamelScott T Copelan Maultsby, MD 10/14/14 [id number]

## 2014-10-16 LAB — GC/CHLAMYDIA PROBE AMP (~~LOC~~) NOT AT ARMC
Chlamydia: NEGATIVE
NEISSERIA GONORRHEA: NEGATIVE

## 2016-12-03 ENCOUNTER — Emergency Department (HOSPITAL_BASED_OUTPATIENT_CLINIC_OR_DEPARTMENT_OTHER): Payer: Medicaid Other

## 2016-12-03 ENCOUNTER — Encounter (HOSPITAL_BASED_OUTPATIENT_CLINIC_OR_DEPARTMENT_OTHER): Payer: Self-pay | Admitting: Emergency Medicine

## 2016-12-03 ENCOUNTER — Emergency Department (HOSPITAL_BASED_OUTPATIENT_CLINIC_OR_DEPARTMENT_OTHER)
Admission: EM | Admit: 2016-12-03 | Discharge: 2016-12-03 | Disposition: A | Discharge status: Home / Self Care | Payer: Medicaid Other | Attending: Emergency Medicine | Admitting: Emergency Medicine

## 2016-12-03 DIAGNOSIS — J45909 Unspecified asthma, uncomplicated: Secondary | ICD-10-CM | POA: Insufficient documentation

## 2016-12-03 DIAGNOSIS — B9789 Other viral agents as the cause of diseases classified elsewhere: Secondary | ICD-10-CM

## 2016-12-03 DIAGNOSIS — Z79899 Other long term (current) drug therapy: Secondary | ICD-10-CM | POA: Insufficient documentation

## 2016-12-03 DIAGNOSIS — J069 Acute upper respiratory infection, unspecified: Secondary | ICD-10-CM

## 2016-12-03 LAB — RAPID STREP SCREEN (MED CTR MEBANE ONLY): STREPTOCOCCUS, GROUP A SCREEN (DIRECT): NEGATIVE

## 2016-12-03 MED ORDER — BENZONATATE 100 MG PO CAPS: 100.0000 mg | ORAL_CAPSULE | Freq: Three times a day (TID) | ORAL | 0 refills | Status: DC

## 2016-12-03 NOTE — ED Triage Notes (Signed)
PT presents to ED with c/o cough, sore throat, nasal drainage, body aches for 3 days.

## 2016-12-03 NOTE — Discharge Instructions (Signed)
Recommend to continue mucinex.  Use tessalon for cough.  Can use chloraseptic spray for sore throat if needed. Follow-up with your primary care doctor. Return here for any new/worsening symptoms.

## 2016-12-03 NOTE — ED Provider Notes (Signed)
[facility] Provider Note   CSN: [id number] Arrival date & time: 12/03/16  2038  By signing my name below, I, Alexis Peters, attest that this documentation has been prepared under the direction and in the presence of Sharilyn Sites, PA-C.  Electronically Signed: Octavia Peters, ED Scribe. 12/03/16. 10:20 PM.    History   Chief Complaint Chief Complaint  Patient presents with  . Cough   The history is provided by the patient. No language interpreter was used.   HPI Comments: Alexis Peters is a 26 y.o. female who has a PMhx of asthma presents to the Emergency Department complaining of a moderate, gradual improving, persistent dry cough x 3 days. She reports associated sore throat, rhinorrhea, subjective fever and generalized myalgias.  She notes taking OTC Mucinex and allergy medication to alleviate her symptoms with moderate relief. She denies chest pain or shortness of breath.  Past Medical History:  Diagnosis Date  . Asthma     There are no active problems to display for this patient.   Past Surgical History:  Procedure Laterality Date  . WISDOM TOOTH EXTRACTION      OB History    No data available       Home Medications    Prior to Admission medications   Medication Sig Start Date End Date Taking? Authorizing Provider  albuterol (PROVENTIL HFA;VENTOLIN HFA) 108 (90 BASE) MCG/ACT inhaler Inhale into the lungs every 6 (six) hours as needed for wheezing or shortness of breath.    Historical Provider, MD  ibuprofen (ADVIL,MOTRIN) 400 MG tablet Take 1 tablet (400 mg total) by mouth every 6 (six) hours as needed. 07/26/14   Fayrene Helper, PA-C  methocarbamol (ROBAXIN) 500 MG tablet Take 1 tablet (500 mg total) by mouth 2 (two) times daily. 07/26/14   Fayrene Helper, PA-C  metroNIDAZOLE (FLAGYL) 500 MG tablet Take 1 tablet (500 mg total) by mouth 2 (two) times daily. One po bid x 7 days 10/14/14   Pricilla Loveless, MD  omeprazole (PRILOSEC) 20 MG capsule Take 1 capsule (20  mg total) by mouth daily. 03/21/14   Elpidio Anis, PA-C    Family History No family history on file.  Social History Social History  Substance Use Topics  . Smoking status: Never Smoker  . Smokeless tobacco: Not on file  . Alcohol use No     Allergies   Patient has no known allergies.   Review of Systems Review of Systems  Constitutional: Positive for fever.  HENT: Positive for postnasal drip, rhinorrhea and sore throat.   Respiratory: Positive for cough. Negative for shortness of breath.   Cardiovascular: Negative for chest pain.  Musculoskeletal: Positive for myalgias.  All other systems reviewed and are negative.    Physical Exam Updated Vital Signs BP 110/85 (BP Location: Right Arm)   Pulse 71   Temp 98.9 F (37.2 C) (Oral)   Resp 17   Ht 5' (1.524 m)   Wt 95 lb (43.1 kg)   SpO2 99%   BMI 18.55 kg/m   Physical Exam  Constitutional: She is oriented to person, place, and time. She appears well-developed and well-nourished.  HENT:  Head: Normocephalic and atraumatic.  Right Ear: Tympanic membrane and ear canal normal.  Left Ear: Tympanic membrane and ear canal normal.  Nose: Nose normal.  Mouth/Throat: Uvula is midline, oropharynx is clear and moist and mucous membranes are normal.  + nasal congestion and PND noted, Tonsils overall normal in appearance bilaterally without exudate; uvula midline without  evidence of peritonsillar abscess; handling secretions appropriately; no difficulty swallowing or speaking; normal phonation without stridor  Eyes: Conjunctivae and EOM are normal. Pupils are equal, round, and reactive to light.  Neck: Normal range of motion.  Cardiovascular: Normal rate, regular rhythm and normal heart sounds.   Pulmonary/Chest: Effort normal and breath sounds normal. She has no wheezes. She has no rhonchi. She has no rales.  Lungs clear, no distress  Abdominal: Soft. Bowel sounds are normal.  Musculoskeletal: Normal range of motion.    Neurological: She is alert and oriented to person, place, and time.  Skin: Skin is warm and dry.  Psychiatric: She has a normal mood and affect.  Nursing note and vitals reviewed.    ED Treatments / Results  DIAGNOSTIC STUDIES: Oxygen Saturation is 99% on RA, normal by my interpretation.  COORDINATION OF CARE:  10:20 PM Discussed treatment plan with pt at bedside and pt agreed to plan.  Labs (all labs ordered are listed, but only abnormal results are displayed) Labs Reviewed  RAPID STREP SCREEN (NOT AT [facility])  CULTURE, GROUP A STREP [facility])    EKG  EKG Interpretation None       Radiology Dg Chest 2 View  Result Date: 12/03/2016 CLINICAL DATA:  Nonproductive cough, sore throat, chest pain, headache, and nasal congestion for 3 days. EXAM: CHEST  2 VIEW COMPARISON:  05/14/2013 FINDINGS: The cardiomediastinal silhouette is within normal limits. The lungs are well inflated and clear. There is no evidence of pleural effusion or pneumothorax. No acute osseous abnormality is identified. IMPRESSION: No active cardiopulmonary disease. Electronically Signed   By: Sebastian Ache M.D.   On: 12/03/2016 21:33    Procedures Procedures (including critical care time)  Medications Ordered in ED Medications - No data to display   Initial Impression / Assessment and Plan / ED Course  I have reviewed the triage vital signs and the nursing notes.  Pertinent labs & imaging results that were available during my care of the patient were reviewed by me and considered in my medical decision making (see chart for details).  26 year old female here with URI type symptoms 3 days. She is afebrile and nontoxic. Her exam is overall benign aside from some mild postnasal drip and nasal congestion.  Lungs clear without wheezes or rhonchi. Rapid strep test and chest x-ray which were obtained from triage are both negative. Strep test has been sent for culture. Suspect her symptoms are viral in nature. Will  discharge home with continued supportive care. Tessalon prescribed cough. Recommended close follow-up with PCP.  Discussed plan with patient, she acknowledged understanding and agreed with plan of care.  Return precautions given for new or worsening symptoms.  Final Clinical Impressions(s) / ED Diagnoses   Final diagnoses:  Viral URI with cough   New Prescriptions New Prescriptions   No medications on file   I personally performed the services described in this documentation, which was scribed in my presence. The recorded information has been reviewed and is accurate.   Garlon Hatchet, PA-C 12/03/16 2349    Lyndal Pulley, MD 12/04/16 [phone]

## 2016-12-06 LAB — CULTURE, GROUP A STREP (THRC)

## 2017-04-22 ENCOUNTER — Emergency Department (HOSPITAL_BASED_OUTPATIENT_CLINIC_OR_DEPARTMENT_OTHER)
Admission: EM | Admit: 2017-04-22 | Discharge: 2017-04-22 | Disposition: A | Discharge status: Home / Self Care | Payer: Worker's Compensation | Attending: Emergency Medicine | Admitting: Emergency Medicine

## 2017-04-22 ENCOUNTER — Encounter (HOSPITAL_BASED_OUTPATIENT_CLINIC_OR_DEPARTMENT_OTHER): Payer: Self-pay | Admitting: Emergency Medicine

## 2017-04-22 DIAGNOSIS — Z79899 Other long term (current) drug therapy: Secondary | ICD-10-CM | POA: Insufficient documentation

## 2017-04-22 DIAGNOSIS — Y990 Civilian activity done for income or pay: Secondary | ICD-10-CM | POA: Insufficient documentation

## 2017-04-22 DIAGNOSIS — S0093XA Contusion of unspecified part of head, initial encounter: Secondary | ICD-10-CM | POA: Insufficient documentation

## 2017-04-22 DIAGNOSIS — S060X0A Concussion without loss of consciousness, initial encounter: Secondary | ICD-10-CM | POA: Diagnosis not present

## 2017-04-22 DIAGNOSIS — T148XXA Other injury of unspecified body region, initial encounter: Secondary | ICD-10-CM

## 2017-04-22 DIAGNOSIS — J45909 Unspecified asthma, uncomplicated: Secondary | ICD-10-CM | POA: Diagnosis not present

## 2017-04-22 DIAGNOSIS — S0990XA Unspecified injury of head, initial encounter: Secondary | ICD-10-CM | POA: Diagnosis present

## 2017-04-22 DIAGNOSIS — Y929 Unspecified place or not applicable: Secondary | ICD-10-CM | POA: Diagnosis not present

## 2017-04-22 DIAGNOSIS — W2209XA Striking against other stationary object, initial encounter: Secondary | ICD-10-CM | POA: Insufficient documentation

## 2017-04-22 DIAGNOSIS — Y9389 Activity, other specified: Secondary | ICD-10-CM | POA: Diagnosis not present

## 2017-04-22 MED ORDER — MELOXICAM 15 MG PO TABS: 15.0000 mg | ORAL_TABLET | Freq: Every day | ORAL | 0 refills | Status: DC

## 2017-04-22 NOTE — ED Notes (Addendum)
EDPA into room, prior to RN assessment, see PA notes, orders received to d/c. Care assumed at time of d/c. Pt standing in doorway "ready to go".   Alert, NAD, calm, interactive, resps e/u, speaking in clear complete sentences, no dyspnea noted. Steady gait.

## 2017-04-22 NOTE — ED Triage Notes (Signed)
Patient reports that she hit her head about 1 week ago at work. The patient reports that she also started to have bleeding after her last injection. - Patient reports that this is why she is concerned. Also reports that she feel like she has a bump and a dent to her head in the area where she hit her head

## 2017-04-22 NOTE — ED Provider Notes (Signed)
[facility]   CSN: [id number] Arrival date & time: 04/22/17  1949     History   Chief Complaint Chief Complaint  Patient presents with  . Head Injury    HPI Alexis Peters is a 26 y.o. female who presents emergency Department with chief complaint of head injury. Head injury occurred 2 days ago was she was at work. Patient states that she was bending down to put away food items when she lifts up hitting her head on the corner of a metal table. She states that it was extremely painful and she decided on the ground and holding her head. She denies losing consciousness. Patient states that day she noticed a large and painful swelling on the back of her head and states she just "wanted make sure it was okay." She states that after the injury occurred. She is very sleepy for about 2 days. She denies other neurologic deficits, she denies headaches, changes in vision, vertigo, nausea, vomiting.  HPI  Past Medical History:  Diagnosis Date  . Asthma     There are no active problems to display for this patient.   Past Surgical History:  Procedure Laterality Date  . WISDOM TOOTH EXTRACTION      OB History    No data available       Home Medications    Prior to Admission medications   Medication Sig Start Date End Date Taking? Authorizing Provider  albuterol (PROVENTIL HFA;VENTOLIN HFA) 108 (90 BASE) MCG/ACT inhaler Inhale into the lungs every 6 (six) hours as needed for wheezing or shortness of breath.    [provider]  benzonatate (TESSALON) 100 MG capsule Take 1 capsule (100 mg total) by mouth every 8 (eight) hours. 12/03/16   Garlon Hatchet, PA-C  ibuprofen (ADVIL,MOTRIN) 400 MG tablet Take 1 tablet (400 mg total) by mouth every 6 (six) hours as needed. 07/26/14   Fayrene Helper, PA-C  meloxicam (MOBIC) 15 MG tablet Take 1 tablet (15 mg total) by mouth daily. 04/22/17   Arthor Captain, PA-C  methocarbamol (ROBAXIN) 500 MG tablet Take 1 tablet  (500 mg total) by mouth 2 (two) times daily. 07/26/14   Fayrene Helper, PA-C  metroNIDAZOLE (FLAGYL) 500 MG tablet Take 1 tablet (500 mg total) by mouth 2 (two) times daily. One po bid x 7 days 10/14/14   Pricilla Loveless, MD  omeprazole (PRILOSEC) 20 MG capsule Take 1 capsule (20 mg total) by mouth daily. 03/21/14   Elpidio Anis, PA-C    Family History History reviewed. No pertinent family history.  Social History Social History  Substance Use Topics  . Smoking status: Never Smoker  . Smokeless tobacco: Never Used  . Alcohol use No     Allergies   Patient has no known allergies.   Review of Systems Review of Systems  Ten systems reviewed and are negative for acute change, except as noted in the HPI.   Physical Exam Updated Vital Signs BP 118/72 (BP Location: Right Arm)   Pulse 75   Temp 99.3 F (37.4 C) (Oral)   Resp 16   Ht 5' (1.524 m)   Wt 39.9 kg (88 lb)   SpO2 100%   BMI 17.19 kg/m   Physical Exam  Physical Exam  Nursing note and vitals reviewed. Constitutional: She is oriented to person, place, and time. She appears well-developed and well-nourished. No distress.  HENT:  Head: Normocephalic and 3 cm hematoma on the occipital region of the head..  Eyes:  Conjunctivae normal and EOM are normal. Pupils are equal, round, and reactive to light. No scleral icterus.  Neck: Normal range of motion.  Cardiovascular: Normal rate, regular rhythm and normal heart sounds.  Exam reveals no gallop and no friction rub.   No murmur heard. Pulmonary/Chest: Effort normal and breath sounds normal. No respiratory distress.  Abdominal: Soft. Bowel sounds are normal. She exhibits no distension and no mass. There is no tenderness. There is no guarding.  Neurological: Speech is clear and goal oriented, follows commands Major Cranial nerves without deficit, no facial droop Normal strength in upper and lower extremities bilaterally including dorsiflexion and plantar flexion, strong and  equal grip strength Sensation normal to light and sharp touch Moves extremities without ataxia, coordination intact Normal finger to nose and rapid alternating movements Neg romberg, no pronator drift Normal gait Normal heel-shin and balance Skin: Skin is warm and dry. She is not diaphoretic.    ED Treatments / Results  Labs (all labs ordered are listed, but only abnormal results are displayed) Labs Reviewed - No data to display  EKG  EKG Interpretation None       Radiology No results found.  Procedures Procedures (including critical care time)  Medications Ordered in ED Medications - No data to display   Initial Impression / Assessment and Plan / ED Course  I have reviewed the triage vital signs and the nursing notes.  Pertinent labs & imaging results that were available during my care of the patient were reviewed by me and considered in my medical decision making (see chart for details).     Patient symptoms consistent with concussion. No vomiting. No focal neurological deficits on physical exam. . CT is not indicated at this time. Discussed symptoms of post concussive syndrome and reasons to return to the emergency department including any new  severe headaches, disequilibrium, vomiting, double vision, extremity weakness, difficulty ambulating, or any other concerning symptoms. Patient will be discharged with information pertaining to diagnosis. Pt is safe for discharge at this time.  Final Clinical Impressions(s) / ED Diagnoses   Final diagnoses:  Hematoma and contusion  Concussion without loss of consciousness, initial encounter    New Prescriptions Discharge Medication List as of 04/22/2017  9:59 PM    START taking these medications   Details  meloxicam (MOBIC) 15 MG tablet Take 1 tablet (15 mg total) by mouth daily., Starting Sun 04/22/2017, Print         Arthor Captain, PA-C 04/23/17 0110    Mesner, Barbara Cower, MD 04/25/17 [phone]

## 2017-04-22 NOTE — Discharge Instructions (Signed)
Get help right away if: °You have confusion or unusual drowsiness. °Others find it difficult to wake you up. °You have nausea or persistent, forceful vomiting. °You feel like you are moving when you are not (vertigo). Your eyes may move rapidly back and forth. °You have convulsions or faint. °You have severe, persistent headaches that are not relieved by medicine. °You cannot use your arms or legs normally. °One of your pupils is larger than the other. °You have clear or bloody discharge from your nose or ears. °Your problems are getting worse, not better. °

## 2017-09-17 ENCOUNTER — Other Ambulatory Visit: Payer: Self-pay

## 2017-09-17 ENCOUNTER — Emergency Department (HOSPITAL_BASED_OUTPATIENT_CLINIC_OR_DEPARTMENT_OTHER)
Admission: EM | Admit: 2017-09-17 | Discharge: 2017-09-17 | Disposition: A | Discharge status: Home / Self Care | Payer: Self-pay | Attending: Emergency Medicine | Admitting: Emergency Medicine

## 2017-09-17 ENCOUNTER — Encounter (HOSPITAL_BASED_OUTPATIENT_CLINIC_OR_DEPARTMENT_OTHER): Payer: Self-pay | Admitting: *Deleted

## 2017-09-17 DIAGNOSIS — Z5321 Procedure and treatment not carried out due to patient leaving prior to being seen by health care provider: Secondary | ICD-10-CM | POA: Insufficient documentation

## 2017-09-17 DIAGNOSIS — R079 Chest pain, unspecified: Secondary | ICD-10-CM | POA: Insufficient documentation

## 2017-09-17 NOTE — ED Notes (Signed)
Pt. Said she was not going to stay after EMS brought her and she has been triaged.  Pt. Said she feels better and is in no distress.  Pt. Has a ride on the way to get her and is waiting in the lobby.

## 2017-09-17 NOTE — ED Triage Notes (Signed)
Panic attack at work today. She is alert and c.o chest hurting at triage. She has calmed down from when it first started.

## 2017-09-24 ENCOUNTER — Emergency Department (HOSPITAL_BASED_OUTPATIENT_CLINIC_OR_DEPARTMENT_OTHER): Payer: No Typology Code available for payment source

## 2017-09-24 ENCOUNTER — Encounter (HOSPITAL_BASED_OUTPATIENT_CLINIC_OR_DEPARTMENT_OTHER): Payer: Self-pay | Admitting: Emergency Medicine

## 2017-09-24 ENCOUNTER — Other Ambulatory Visit: Payer: Self-pay

## 2017-09-24 ENCOUNTER — Emergency Department (HOSPITAL_BASED_OUTPATIENT_CLINIC_OR_DEPARTMENT_OTHER)
Admission: EM | Admit: 2017-09-24 | Discharge: 2017-09-24 | Disposition: A | Discharge status: Home / Self Care | Payer: No Typology Code available for payment source | Attending: Emergency Medicine | Admitting: Emergency Medicine

## 2017-09-24 DIAGNOSIS — S39012A Strain of muscle, fascia and tendon of lower back, initial encounter: Secondary | ICD-10-CM

## 2017-09-24 DIAGNOSIS — Y929 Unspecified place or not applicable: Secondary | ICD-10-CM | POA: Insufficient documentation

## 2017-09-24 DIAGNOSIS — S3992XA Unspecified injury of lower back, initial encounter: Secondary | ICD-10-CM | POA: Diagnosis present

## 2017-09-24 DIAGNOSIS — J45909 Unspecified asthma, uncomplicated: Secondary | ICD-10-CM | POA: Diagnosis not present

## 2017-09-24 DIAGNOSIS — Y999 Unspecified external cause status: Secondary | ICD-10-CM | POA: Insufficient documentation

## 2017-09-24 DIAGNOSIS — Z79899 Other long term (current) drug therapy: Secondary | ICD-10-CM | POA: Diagnosis not present

## 2017-09-24 DIAGNOSIS — Y9389 Activity, other specified: Secondary | ICD-10-CM | POA: Diagnosis not present

## 2017-09-24 MED ORDER — NAPROXEN 500 MG PO TABS: 500.0000 mg | ORAL_TABLET | Freq: Two times a day (BID) | ORAL | 0 refills | Status: DC

## 2017-09-24 NOTE — ED Notes (Signed)
NAD at this time. Pt its stable and going home.
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
## 2017-09-24 NOTE — Discharge Instructions (Signed)
Take the Naprosyn as directed and needed.  X-rays of the thoracic part of the spine and the lumbar part of the spine without any evidence of injury.  Work note provided.
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
## 2018-11-01 ENCOUNTER — Other Ambulatory Visit: Payer: Self-pay

## 2018-11-01 ENCOUNTER — Emergency Department (HOSPITAL_BASED_OUTPATIENT_CLINIC_OR_DEPARTMENT_OTHER)
Admission: EM | Admit: 2018-11-01 | Discharge: 2018-11-01 | Disposition: A | Discharge status: Home / Self Care | Payer: Self-pay | Attending: Emergency Medicine | Admitting: Emergency Medicine

## 2018-11-01 ENCOUNTER — Encounter (HOSPITAL_BASED_OUTPATIENT_CLINIC_OR_DEPARTMENT_OTHER): Payer: Self-pay | Admitting: Emergency Medicine

## 2018-11-01 DIAGNOSIS — R112 Nausea with vomiting, unspecified: Secondary | ICD-10-CM | POA: Insufficient documentation

## 2018-11-01 DIAGNOSIS — R197 Diarrhea, unspecified: Secondary | ICD-10-CM | POA: Insufficient documentation

## 2018-11-01 DIAGNOSIS — J45909 Unspecified asthma, uncomplicated: Secondary | ICD-10-CM | POA: Insufficient documentation

## 2018-11-01 LAB — URINALYSIS, ROUTINE W REFLEX MICROSCOPIC
Bilirubin Urine: NEGATIVE
GLUCOSE, UA: NEGATIVE mg/dL
HGB URINE DIPSTICK: NEGATIVE
Ketones, ur: 15 mg/dL — AB
Leukocytes,Ua: NEGATIVE
Nitrite: NEGATIVE
Protein, ur: NEGATIVE mg/dL
Specific Gravity, Urine: 1.02 (ref 1.005–1.030)
pH: 6 (ref 5.0–8.0)

## 2018-11-01 LAB — PREGNANCY, URINE: Preg Test, Ur: NEGATIVE

## 2018-11-01 MED ORDER — SODIUM CHLORIDE 0.9 % IV BOLUS
1000.0000 mL | Freq: Once | INTRAVENOUS | Status: AC
  Administered 2018-11-01: 1000 mL via INTRAVENOUS

## 2018-11-01 MED ORDER — ONDANSETRON 4 MG PO TBDP: 4.0000 mg | ORAL_TABLET | Freq: Three times a day (TID) | ORAL | 0 refills | Status: DC | PRN

## 2018-11-01 MED ORDER — ONDANSETRON HCL 4 MG/2ML IJ SOLN
4.0000 mg | Freq: Once | INTRAMUSCULAR | Status: AC
  Administered 2018-11-01: 4 mg via INTRAVENOUS
  Filled 2018-11-01: qty 2

## 2018-11-01 NOTE — ED Provider Notes (Signed)
[facility] EMERGENCY DEPARTMENT Provider Note   CSN: [id number] Arrival date & time: 11/01/18  1049    History   Chief Complaint Chief Complaint  Patient presents with  . N/V/D    HPI Alexis Peters is a 28 y.o. female.     27 year old female with history of asthma who presents with nausea, vomiting, and diarrhea.  Overnight early into this morning, she began having nausea and vomiting associated with nonbloody diarrhea.  She tried drinking something but later had another episode of vomiting.  She reports mild crampy generalized abdominal soreness but no severe pain.  No urinary symptoms, fevers, or URI symptoms.  She is currently menstruating.  No vaginal discharge.  No recent travel or sick contacts.  No medications prior to arrival.  The history is provided by the patient.    Past Medical History:  Diagnosis Date  . Asthma     There are no active problems to display for this patient.   Past Surgical History:  Procedure Laterality Date  . WISDOM TOOTH EXTRACTION       OB History   No obstetric history on file.      Home Medications    Prior to Admission medications   Medication Sig Start Date End Date Taking? Authorizing Provider  albuterol (PROVENTIL HFA;VENTOLIN HFA) 108 (90 BASE) MCG/ACT inhaler Inhale into the lungs every 6 (six) hours as needed for wheezing or shortness of breath.    [provider]  ondansetron (ZOFRAN ODT) 4 MG disintegrating tablet Take 1 tablet (4 mg total) by mouth every 8 (eight) hours as needed for nausea or vomiting. 11/01/18   , Ambrose Finland, MD    Family History No family history on file.  Social History Social History   Tobacco Use  . Smoking status: Never Smoker  . Smokeless tobacco: Never Used  Substance Use Topics  . Alcohol use: No  . Drug use: Yes    Types: Marijuana     Allergies   Metronidazole   Review of Systems Review of Systems All other systems reviewed and are negative  except that which was mentioned in HPI   Physical Exam Updated Vital Signs BP 127/83 (BP Location: Left Arm)   Pulse (!) 103   Temp 98.3 F (36.8 C) (Oral)   Resp 16   Ht 5' (1.524 m)   Wt 50.8 kg   LMP 11/01/2018   SpO2 100%   BMI 21.87 kg/m   Physical Exam Vitals signs and nursing note reviewed.  Constitutional:      General: She is not in acute distress.    Appearance: She is well-developed.  HENT:     Head: Normocephalic and atraumatic.     Mouth/Throat:     Mouth: Mucous membranes are moist.     Comments: Mild erythema posterior oropharynx Eyes:     Conjunctiva/sclera: Conjunctivae normal.  Neck:     Musculoskeletal: Neck supple.  Cardiovascular:     Rate and Rhythm: Normal rate and regular rhythm.     Heart sounds: Normal heart sounds. No murmur.  Pulmonary:     Effort: Pulmonary effort is normal.     Breath sounds: Normal breath sounds.  Abdominal:     General: Bowel sounds are normal. There is no distension.     Palpations: Abdomen is soft.     Tenderness: There is no abdominal tenderness.  Skin:    General: Skin is warm and dry.  Neurological:     Mental Status: She  is alert and oriented to person, place, and time.     Comments: Fluent speech  Psychiatric:        Judgment: Judgment normal.      ED Treatments / Results  Labs (all labs ordered are listed, but only abnormal results are displayed) Labs Reviewed  URINALYSIS, ROUTINE W REFLEX MICROSCOPIC - Abnormal; Notable for the following components:      Result Value   Ketones, ur 15 (*)    All other components within normal limits  PREGNANCY, URINE    EKG None  Radiology No results found.  Procedures Procedures (including critical care time)  Medications Ordered in ED Medications  ondansetron (ZOFRAN) injection 4 mg (4 mg Intravenous Given 11/01/18 1134)  sodium chloride 0.9 % bolus 1,000 mL (0 mLs Intravenous Stopped 11/01/18 1254)     Initial Impression / Assessment and Plan / ED  Course  I have reviewed the triage vital signs and the nursing notes.  Pertinent labs that were available during my care of the patient were reviewed by me and considered in my medical decision making (see chart for details).        Well-appearing on exam, reassuring vital signs.  No abdominal tenderness.  UA normal without evidence of infection or dehydration.  UPT negative.  Gave Zofran and IV fluid bolus.  She was well-appearing and stated she felt better on reassessment and has been tolerating liquids here.  Discussed supportive measures and reviewed return precautions.  She voiced understanding.  Final Clinical Impressions(s) / ED Diagnoses   Final diagnoses:  Nausea vomiting and diarrhea    ED Discharge Orders         Ordered    ondansetron (ZOFRAN ODT) 4 MG disintegrating tablet  Every 8 hours PRN     11/01/18 1256           , Ambrose Finland, MD 11/01/18 [phone]

## 2018-11-01 NOTE — ED Triage Notes (Addendum)
Woke up this am vomiting and diarrhea, lower back pain x 10 min

## 2018-12-17 ENCOUNTER — Emergency Department (HOSPITAL_BASED_OUTPATIENT_CLINIC_OR_DEPARTMENT_OTHER)
Admission: EM | Admit: 2018-12-17 | Discharge: 2018-12-17 | Disposition: A | Discharge status: Home / Self Care | Payer: Self-pay | Attending: Emergency Medicine | Admitting: Emergency Medicine

## 2018-12-17 ENCOUNTER — Other Ambulatory Visit: Payer: Self-pay

## 2018-12-17 ENCOUNTER — Encounter (HOSPITAL_BASED_OUTPATIENT_CLINIC_OR_DEPARTMENT_OTHER): Payer: Self-pay | Admitting: Emergency Medicine

## 2018-12-17 DIAGNOSIS — J029 Acute pharyngitis, unspecified: Secondary | ICD-10-CM | POA: Insufficient documentation

## 2018-12-17 DIAGNOSIS — J45909 Unspecified asthma, uncomplicated: Secondary | ICD-10-CM | POA: Insufficient documentation

## 2018-12-17 LAB — GROUP A STREP BY PCR: Group A Strep by PCR: DETECTED — AB

## 2018-12-17 MED ORDER — AMOXICILLIN 500 MG PO CAPS: 500.0000 mg | ORAL_CAPSULE | Freq: Three times a day (TID) | ORAL | 0 refills | Status: DC

## 2018-12-17 NOTE — Discharge Instructions (Signed)
You will be called if your strep test is positive and if so, antibiotics will be sent to the [facility].  Otherwise, you will need to take ibuprofen and Tylenol for pain.  If at any point you develop high fever, vomiting, trouble breathing, inability to swallow, drooling, neck stiffness, or any other new/concerning symptoms then return to the ER for evaluation.

## 2018-12-17 NOTE — ED Provider Notes (Signed)
[facility] EMERGENCY DEPARTMENT Provider Note   CSN: [id number] Arrival date & time: 12/17/18  0850    History   Chief Complaint Chief Complaint  Patient presents with  . Sore Throat    HPI Alexis Peters is a 28 y.o. female.     HPI  28 year old female presents with sore throat since yesterday.  Progressively worsening.  Worse on the right.  No fevers or vomiting.  No trouble breathing.  It is very painful to swallow.  No neck stiffness.  No recent sick contacts and no known contacts with COVID-19 patient. No ear pain.  Past Medical History:  Diagnosis Date  . Asthma     There are no active problems to display for this patient.   Past Surgical History:  Procedure Laterality Date  . WISDOM TOOTH EXTRACTION       OB History   No obstetric history on file.      Home Medications    Prior to Admission medications   Medication Sig Start Date End Date Taking? Authorizing Provider  albuterol (PROVENTIL HFA;VENTOLIN HFA) 108 (90 BASE) MCG/ACT inhaler Inhale into the lungs every 6 (six) hours as needed for wheezing or shortness of breath.    [provider]  ondansetron (ZOFRAN ODT) 4 MG disintegrating tablet Take 1 tablet (4 mg total) by mouth every 8 (eight) hours as needed for nausea or vomiting. 11/01/18   Little, Ambrose Finland, MD    Family History No family history on file.  Social History Social History   Tobacco Use  . Smoking status: Never Smoker  . Smokeless tobacco: Never Used  Substance Use Topics  . Alcohol use: No  . Drug use: Yes    Types: Marijuana     Allergies   Metronidazole   Review of Systems Review of Systems  Constitutional: Negative for fever.  HENT: Positive for sore throat. Negative for ear pain.   Respiratory: Negative for cough and shortness of breath.   Gastrointestinal: Negative for vomiting.     Physical Exam Updated Vital Signs BP (!) 141/91 (BP Location: Right Arm)   Pulse 95   Temp 98.6 F  (37 C) (Oral)   Resp 14   Ht 5' (1.524 m)   Wt 49.9 kg   SpO2 95%   BMI 21.48 kg/m   Physical Exam Vitals signs and nursing note reviewed.  Constitutional:      General: She is not in acute distress.    Appearance: She is well-developed. She is not ill-appearing or diaphoretic.  HENT:     Head: Normocephalic and atraumatic.     Comments: No stridor. Normal phonation    Right Ear: External ear normal.     Left Ear: External ear normal.     Nose: Nose normal.     Mouth/Throat:     Pharynx: Uvula midline. Oropharyngeal exudate and posterior oropharyngeal erythema present. No uvula swelling.     Tonsils: Tonsillar exudate present. No tonsillar abscesses. 2+ on the right. 2+ on the left.  Eyes:     General:        Right eye: No discharge.        Left eye: No discharge.  Neck:     Musculoskeletal: Normal range of motion and neck supple.  Cardiovascular:     Rate and Rhythm: Normal rate and regular rhythm.     Heart sounds: Normal heart sounds.  Pulmonary:     Effort: Pulmonary effort is normal.     Breath  sounds: Normal breath sounds.  Abdominal:     Palpations: Abdomen is soft.     Tenderness: There is no abdominal tenderness.  Lymphadenopathy:     Cervical: Cervical adenopathy (mild, nontender) present.  Skin:    General: Skin is warm and dry.  Neurological:     Mental Status: She is alert.  Psychiatric:        Mood and Affect: Mood is not anxious.      ED Treatments / Results  Labs (all labs ordered are listed, but only abnormal results are displayed) Labs Reviewed - No data to display  EKG None  Radiology No results found.  Procedures Procedures (including critical care time)  Medications Ordered in ED Medications - No data to display   Initial Impression / Assessment and Plan / ED Course  I have reviewed the triage vital signs and the nursing notes.  Pertinent labs & imaging results that were available during my care of the patient were reviewed  by me and considered in my medical decision making (see chart for details).        9:18 AM Patient appears well.  Her modified Centor is a 2.  Thus, strep test has been sent.  However apparently this does not return within a couple hours here and thus, I discussed sending antibiotics to her pharmacy only if it becomes positive.  If negative, she will need to continue supportive care.  Highly doubt deep space infection.  We discussed return precautions.  11:30 AM Strep is positive. Amoxicillin e-prescribed and I called patient to discuss results and need for antibiotic therapy.  Alexis Peters was evaluated in Emergency Department on 12/17/2018 for the symptoms described in the history of present illness. She was evaluated in the context of the global COVID-19 pandemic, which necessitated consideration that the patient might be at risk for infection with the SARS-CoV-2 virus that causes COVID-19. Institutional protocols and algorithms that pertain to the evaluation of patients at risk for COVID-19 are in a state of rapid change based on information released by regulatory bodies including the CDC and federal and state organizations. These policies and algorithms were followed during the patient's care in the ED.   Final Clinical Impressions(s) / ED Diagnoses   Final diagnoses:  Pharyngitis, unspecified etiology    ED Discharge Orders    None       Pricilla LovelessGoldston, Aliana Kreischer, MD 12/17/18 1130

## 2018-12-17 NOTE — ED Triage Notes (Signed)
Sore throat since yesterday.  Some right sided ear pain.  No known fever.

## 2020-01-08 ENCOUNTER — Emergency Department (HOSPITAL_BASED_OUTPATIENT_CLINIC_OR_DEPARTMENT_OTHER): Payer: BC Managed Care – PPO

## 2020-01-08 ENCOUNTER — Emergency Department (HOSPITAL_BASED_OUTPATIENT_CLINIC_OR_DEPARTMENT_OTHER)
Admission: EM | Admit: 2020-01-08 | Discharge: 2020-01-08 | Disposition: A | Discharge status: Home / Self Care | Payer: BC Managed Care – PPO | Attending: Emergency Medicine | Admitting: Emergency Medicine

## 2020-01-08 ENCOUNTER — Other Ambulatory Visit: Payer: Self-pay

## 2020-01-08 DIAGNOSIS — R05 Cough: Secondary | ICD-10-CM | POA: Diagnosis present

## 2020-01-08 DIAGNOSIS — J45909 Unspecified asthma, uncomplicated: Secondary | ICD-10-CM | POA: Diagnosis not present

## 2020-01-08 DIAGNOSIS — Z881 Allergy status to other antibiotic agents status: Secondary | ICD-10-CM | POA: Insufficient documentation

## 2020-01-08 DIAGNOSIS — J069 Acute upper respiratory infection, unspecified: Secondary | ICD-10-CM | POA: Diagnosis not present

## 2020-01-08 DIAGNOSIS — Z20822 Contact with and (suspected) exposure to covid-19: Secondary | ICD-10-CM | POA: Diagnosis not present

## 2020-01-08 DIAGNOSIS — R059 Cough, unspecified: Secondary | ICD-10-CM

## 2020-01-08 LAB — CBC WITH DIFFERENTIAL/PLATELET
Abs Immature Granulocytes: 0.02 10*3/uL (ref 0.00–0.07)
Basophils Absolute: 0.1 10*3/uL (ref 0.0–0.1)
Basophils Relative: 1 %
Eosinophils Absolute: 0.1 10*3/uL (ref 0.0–0.5)
Eosinophils Relative: 1 %
HCT: 44.9 % (ref 36.0–46.0)
Hemoglobin: 15.6 g/dL — ABNORMAL HIGH (ref 12.0–15.0)
Immature Granulocytes: 0 %
Lymphocytes Relative: 19 %
Lymphs Abs: 2.1 10*3/uL (ref 0.7–4.0)
MCH: 28.7 pg (ref 26.0–34.0)
MCHC: 34.7 g/dL (ref 30.0–36.0)
MCV: 82.7 fL (ref 80.0–100.0)
Monocytes Absolute: 0.6 10*3/uL (ref 0.1–1.0)
Monocytes Relative: 6 %
Neutro Abs: 7.8 10*3/uL — ABNORMAL HIGH (ref 1.7–7.7)
Neutrophils Relative %: 73 %
Platelets: 349 10*3/uL (ref 150–400)
RBC: 5.43 MIL/uL — ABNORMAL HIGH (ref 3.87–5.11)
RDW: 13.7 % (ref 11.5–15.5)
WBC: 10.6 10*3/uL — ABNORMAL HIGH (ref 4.0–10.5)
nRBC: 0 % (ref 0.0–0.2)

## 2020-01-08 LAB — BASIC METABOLIC PANEL
Anion gap: 10 (ref 5–15)
BUN: 6 mg/dL (ref 6–20)
CO2: 23 mmol/L (ref 22–32)
Calcium: 9.6 mg/dL (ref 8.9–10.3)
Chloride: 107 mmol/L (ref 98–111)
Creatinine, Ser: 0.86 mg/dL (ref 0.44–1.00)
GFR calc Af Amer: >60 mL/min (ref >60)
GFR calc non Af Amer: >60 mL/min (ref >60)
Glucose, Bld: 87 mg/dL (ref 70–99)
Potassium: 3.9 mmol/L (ref 3.5–5.1)
Sodium: 140 mmol/L (ref 135–145)

## 2020-01-08 LAB — D-DIMER, QUANTITATIVE: D-Dimer, Quant: 0.45 ug/mL-FEU (ref 0.00–0.50)

## 2020-01-08 LAB — TROPONIN I (HIGH SENSITIVITY): Troponin I (High Sensitivity): 2 ng/L (ref <18)

## 2020-01-08 MED ORDER — ALBUTEROL SULFATE HFA 108 (90 BASE) MCG/ACT IN AERS
INHALATION_SPRAY | RESPIRATORY_TRACT | Status: AC
  Administered 2020-01-08: 09:00:00 4
  Filled 2020-01-08: qty 6.7

## 2020-01-08 NOTE — ED Provider Notes (Signed)
[facility] EMERGENCY DEPARTMENT Provider Note   CSN: [id number] Arrival date & time: 01/08/20  0825     History Chief Complaint  Patient presents with  . Cough    Alexis Peters is a 29 y.o. female.  Presents to ER with chief complaint cough.  Cough for the last 3 days has been taking Mucinex with minimal relief.  Cough is productive, mostly clear yellow sputum, no blood.  States that she has been having some intermittent chest discomfort over the last few days as well, worse with cough, not associated with exertion, dull, achy.  Mild to moderate.  Feels mildly short of breath but no shortness of breath at rest.  No no fever, no sick contacts.  Had Covid in October but has not gotten the Covid vaccine yet.  Reports medical history of asthma. HPI     Past Medical History:  Diagnosis Date  . Asthma     There are no problems to display for this patient.   Past Surgical History:  Procedure Laterality Date  . WISDOM TOOTH EXTRACTION       OB History   No obstetric history on file.     No family history on file.  Social History   Tobacco Use  . Smoking status: Never Smoker  . Smokeless tobacco: Never Used  Substance Use Topics  . Alcohol use: No  . Drug use: Yes    Types: Marijuana    Home Medications Prior to Admission medications   Medication Sig Start Date End Date Taking? Authorizing Provider  albuterol (PROVENTIL HFA;VENTOLIN HFA) 108 (90 BASE) MCG/ACT inhaler Inhale into the lungs every 6 (six) hours as needed for wheezing or shortness of breath.    [provider]  amoxicillin (AMOXIL) 500 MG capsule Take 1 capsule (500 mg total) by mouth 3 (three) times daily. 12/17/18   Pricilla Loveless, MD  ondansetron (ZOFRAN ODT) 4 MG disintegrating tablet Take 1 tablet (4 mg total) by mouth every 8 (eight) hours as needed for nausea or vomiting. 11/01/18   Little, Ambrose Finland, MD    Allergies    Metronidazole  Review of Systems   Review of  Systems  Constitutional: Negative for chills and fever.  HENT: Negative for ear pain and sore throat.   Eyes: Negative for pain and visual disturbance.  Respiratory: Positive for cough. Negative for shortness of breath.   Cardiovascular: Negative for chest pain and palpitations.  Gastrointestinal: Negative for abdominal pain and vomiting.  Genitourinary: Negative for dysuria and hematuria.  Musculoskeletal: Negative for arthralgias and back pain.  Skin: Negative for color change and rash.  Neurological: Negative for seizures and syncope.  All other systems reviewed and are negative.   Physical Exam Updated Vital Signs BP 132/88 (BP Location: Right Arm)   Pulse 96   Temp 98.5 F (36.9 C) (Oral)   Resp 16   Ht 5' (1.524 m)   Wt 51.7 kg   SpO2 98%   BMI 22.26 kg/m   Physical Exam Vitals and nursing note reviewed.  Constitutional:      General: She is not in acute distress.    Appearance: She is well-developed.     Comments: No distress but coughing  HENT:     Head: Normocephalic and atraumatic.  Eyes:     Conjunctiva/sclera: Conjunctivae normal.  Cardiovascular:     Rate and Rhythm: Normal rate and regular rhythm.     Heart sounds: No murmur.  Pulmonary:     Comments:  Normal effort, no accessory muscle use, speaks in full sentences, no tachypnea, mild end expiratory wheeze noted bilateral, frequent cough while in room Abdominal:     Palpations: Abdomen is soft.     Tenderness: There is no abdominal tenderness.  Musculoskeletal:        General: No deformity or signs of injury.     Cervical back: Neck supple.  Skin:    General: Skin is warm and dry.     Capillary Refill: Capillary refill takes less than 2 seconds.  Neurological:     General: No focal deficit present.     Mental Status: She is alert.  Psychiatric:        Mood and Affect: Mood normal.        Behavior: Behavior normal.     ED Results / Procedures / Treatments   Labs (all labs ordered are listed,  but only abnormal results are displayed) Labs Reviewed  CBC WITH DIFFERENTIAL/PLATELET - Abnormal; Notable for the following components:      Result Value   WBC 10.6 (*)    RBC 5.43 (*)    Hemoglobin 15.6 (*)    Neutro Abs 7.8 (*)    All other components within normal limits  RESPIRATORY PANEL BY RT PCR (FLU A&B, COVID)  BASIC METABOLIC PANEL  D-DIMER, QUANTITATIVE (NOT AT [facility])  TROPONIN I (HIGH SENSITIVITY)    EKG EKG Interpretation  Date/Time:  Thursday Jan 08 2020 09:20:10 EDT Ventricular Rate:  93 PR Interval:    QRS Duration: 84 QT Interval:  375 QTC Calculation: 467 R Axis:   69 Text Interpretation: Sinus rhythm Borderline short PR interval Right atrial enlargement Borderline repolarization abnormality Confirmed by Marianna Fuss ([id number]) on 01/08/2020 9:47:28 AM   Radiology DG Chest Portable 1 View  Result Date: 01/08/2020 CLINICAL DATA:  Cough and chest discomfort EXAM: PORTABLE CHEST 1 VIEW COMPARISON:  December 03, 2016 FINDINGS: Lungs are clear. Heart size and pulmonary vascularity are normal. No adenopathy. No pneumothorax. No bone lesions. IMPRESSION: No abnormality noted. Electronically Signed   By: Bretta Bang III M.D.   On: 01/08/2020 09:48    Procedures Procedures (including critical care time)  Medications Ordered in ED Medications  albuterol (VENTOLIN HFA) 108 (90 Base) MCG/ACT inhaler (4 puffs  Given 01/08/20 0900)    ED Course  I have reviewed the triage vital signs and the nursing notes.  Pertinent labs & imaging results that were available during my care of the patient were reviewed by me and considered in my medical decision making (see chart for details).    MDM Rules/Calculators/A&P                      29 year old girl presenting to ER with chief complaint of cough and chest pain.  On exam well-appearing, no hypoxia, did note mild end expiratory wheeze.  Reported history of asthma but has not had any issues lately.  Symptoms improved after  receiving albuterol inhaler.  Obtain EKG and chest x-ray given the associated chest pain, no pneumonia.  EKG with some nonspecific ST segment changes, therefore checked labs including troponin and D-dimer.  D-dimer was within normal limits.  Troponin within normal limits, given description of symptoms and these findings, doubt ACS.  Will send for respiratory panel to rule out Covid.  Believe she is appropriate for discharge and outpatient management this time, reviewed return precautions, given relatively mild symptoms and mild wheeze, do not feel she needs course of steroids  at this time but if not improving or worsening would reconsider. Discharged home.    After the discussed management above, the patient was determined to be safe for discharge.  The patient was in agreement with this plan and all questions regarding their care were answered.  ED return precautions were discussed and the patient will return to the ED with any significant worsening of condition.   Final Clinical Impression(s) / ED Diagnoses Final diagnoses:  Cough  Viral URI with cough    Rx / DC Orders ED Discharge Orders    None       Lucrezia Starch, MD 01/08/20 1124

## 2020-01-08 NOTE — ED Triage Notes (Addendum)
Pt reports cough past 3 days, taking mucinex, helping minimally.  Nasal congestion.  Denies fevers.  States chest discomfort with cough.   Denies n/v.  Denies sick contacts.  Hx asthma, ran out of inhaler months ago

## 2020-01-08 NOTE — Discharge Instructions (Signed)
Use inhaler as needed for any additional wheezing.  Take Tylenol Motrin as needed for pain control.  Return to ER if you develop worsening shortness of breath, chest pain, tightness or other new concerning symptom.  Recommend following isolation precautions until we have the results of your coronavirus test.

## 2020-01-09 LAB — RESPIRATORY PANEL BY RT PCR (FLU A&B, COVID)
Influenza A by PCR: NEGATIVE
Influenza B by PCR: NEGATIVE
SARS Coronavirus 2 by RT PCR: NEGATIVE

## 2020-01-10 ENCOUNTER — Emergency Department (HOSPITAL_BASED_OUTPATIENT_CLINIC_OR_DEPARTMENT_OTHER): Payer: BC Managed Care – PPO

## 2020-01-10 ENCOUNTER — Encounter (HOSPITAL_BASED_OUTPATIENT_CLINIC_OR_DEPARTMENT_OTHER): Payer: Self-pay | Admitting: Emergency Medicine

## 2020-01-10 ENCOUNTER — Other Ambulatory Visit: Payer: Self-pay

## 2020-01-10 ENCOUNTER — Emergency Department (HOSPITAL_BASED_OUTPATIENT_CLINIC_OR_DEPARTMENT_OTHER)
Admission: EM | Admit: 2020-01-10 | Discharge: 2020-01-10 | Disposition: A | Discharge status: Home / Self Care | Payer: BC Managed Care – PPO | Attending: Emergency Medicine | Admitting: Emergency Medicine

## 2020-01-10 DIAGNOSIS — R079 Chest pain, unspecified: Secondary | ICD-10-CM

## 2020-01-10 DIAGNOSIS — R0789 Other chest pain: Secondary | ICD-10-CM | POA: Insufficient documentation

## 2020-01-10 DIAGNOSIS — J45909 Unspecified asthma, uncomplicated: Secondary | ICD-10-CM | POA: Insufficient documentation

## 2020-01-10 DIAGNOSIS — R059 Cough, unspecified: Secondary | ICD-10-CM

## 2020-01-10 DIAGNOSIS — R05 Cough: Secondary | ICD-10-CM | POA: Diagnosis not present

## 2020-01-10 DIAGNOSIS — Z79899 Other long term (current) drug therapy: Secondary | ICD-10-CM | POA: Insufficient documentation

## 2020-01-10 LAB — COMPREHENSIVE METABOLIC PANEL
ALT: 14 U/L (ref 0–44)
AST: 18 U/L (ref 15–41)
Albumin: 4.5 g/dL (ref 3.5–5.0)
Alkaline Phosphatase: 73 U/L (ref 38–126)
Anion gap: 10 (ref 5–15)
BUN: 5 mg/dL — ABNORMAL LOW (ref 6–20)
CO2: 22 mmol/L (ref 22–32)
Calcium: 9.2 mg/dL (ref 8.9–10.3)
Chloride: 107 mmol/L (ref 98–111)
Creatinine, Ser: 0.74 mg/dL (ref 0.44–1.00)
GFR calc Af Amer: >60 mL/min (ref >60)
GFR calc non Af Amer: >60 mL/min (ref >60)
Glucose, Bld: 97 mg/dL (ref 70–99)
Potassium: 3.5 mmol/L (ref 3.5–5.1)
Sodium: 139 mmol/L (ref 135–145)
Total Bilirubin: 0.9 mg/dL (ref 0.3–1.2)
Total Protein: 8.1 g/dL (ref 6.5–8.1)

## 2020-01-10 LAB — CBC WITH DIFFERENTIAL/PLATELET
Abs Immature Granulocytes: 0.02 10*3/uL (ref 0.00–0.07)
Basophils Absolute: 0.1 10*3/uL (ref 0.0–0.1)
Basophils Relative: 1 %
Eosinophils Absolute: 0.1 10*3/uL (ref 0.0–0.5)
Eosinophils Relative: 1 %
HCT: 43.8 % (ref 36.0–46.0)
Hemoglobin: 15.1 g/dL — ABNORMAL HIGH (ref 12.0–15.0)
Immature Granulocytes: 0 %
Lymphocytes Relative: 30 %
Lymphs Abs: 2.9 10*3/uL (ref 0.7–4.0)
MCH: 28.1 pg (ref 26.0–34.0)
MCHC: 34.5 g/dL (ref 30.0–36.0)
MCV: 81.6 fL (ref 80.0–100.0)
Monocytes Absolute: 0.7 10*3/uL (ref 0.1–1.0)
Monocytes Relative: 7 %
Neutro Abs: 6 10*3/uL (ref 1.7–7.7)
Neutrophils Relative %: 61 %
Platelets: 360 10*3/uL (ref 150–400)
RBC: 5.37 MIL/uL — ABNORMAL HIGH (ref 3.87–5.11)
RDW: 13.3 % (ref 11.5–15.5)
WBC: 9.7 10*3/uL (ref 4.0–10.5)
nRBC: 0 % (ref 0.0–0.2)

## 2020-01-10 LAB — TROPONIN I (HIGH SENSITIVITY): Troponin I (High Sensitivity): <2 ng/L (ref <18)

## 2020-01-10 LAB — D-DIMER, QUANTITATIVE: D-Dimer, Quant: 0.36 ug/mL-FEU (ref 0.00–0.50)

## 2020-01-10 MED ORDER — PROMETHAZINE-DM 6.25-15 MG/5ML PO SYRP
2.5000 mL | ORAL_SOLUTION | Freq: Four times a day (QID) | ORAL | 0 refills | Status: DC | PRN
Start: 2020-01-10 — End: 2021-07-19

## 2020-01-10 MED ORDER — PREDNISONE 20 MG PO TABS
40.0000 mg | ORAL_TABLET | Freq: Once | ORAL | Status: AC
  Administered 2020-01-10: 15:00:00 40 mg via ORAL
  Filled 2020-01-10: qty 2

## 2020-01-10 MED ORDER — BENZONATATE 100 MG PO CAPS
100.0000 mg | ORAL_CAPSULE | Freq: Three times a day (TID) | ORAL | 0 refills | Status: DC
Start: 2020-01-10 — End: 2021-07-19

## 2020-01-10 MED ORDER — ALBUTEROL SULFATE HFA 108 (90 BASE) MCG/ACT IN AERS: 4.0000 | INHALATION_SPRAY | Freq: Once | RESPIRATORY_TRACT | Status: DC

## 2020-01-10 NOTE — Discharge Instructions (Signed)
Please take cough medications as prescribed.  Tessalon Perles as nondrowsy only to use during the day and Promethazine DM is a cough syrup that may cause drowsiness that you may use at night.  Inhalers return to ED for reevaluation if you have any new or concerning symptoms however I strongly suspect that this is a viral bronchitis that will take some time to improve.

## 2020-01-10 NOTE — ED Provider Notes (Signed)
[facility] Provider Note   CSN: [id number] Arrival date & time: 01/10/20  1216     History Chief Complaint  Patient presents with  . Pleurisy  . Shortness of Breath  . Cough    Alexis Peters is a 29 y.o. female.  HPI  Patient is 30 year old female with a cough since Tuesday which is been intermittent and nonproductive.  She states she was seen here for similar symptoms and given a albuterol inhaler which has not improved her symptoms.  She states that her symptoms have since worsened dramatically.  She states that she is short of breath and having sharp pleuritic chest pain.  She states that she occasionally has chest pain when she coughs or she states that it is also occurring with breathing.  She denies any history of blood clots.  Denies any hemoptysis.  She states that she occasionally feels that she has some palpitations.  Denies any nausea, vomiting, diarrhea.  Denies any fevers or chills.  States that she has feels some general malaise and fatigue.  No lightheadedness or dizziness.     Past Medical History:  Diagnosis Date  . Asthma     There are no problems to display for this patient.   Past Surgical History:  Procedure Laterality Date  . WISDOM TOOTH EXTRACTION       OB History   No obstetric history on file.     No family history on file.  Social History   Tobacco Use  . Smoking status: Never Smoker  . Smokeless tobacco: Never Used  Substance Use Topics  . Alcohol use: No  . Drug use: Yes    Types: Marijuana    Home Medications Prior to Admission medications   Medication Sig Start Date End Date Taking? Authorizing Provider  albuterol (PROVENTIL HFA;VENTOLIN HFA) 108 (90 BASE) MCG/ACT inhaler Inhale into the lungs every 6 (six) hours as needed for wheezing or shortness of breath.    [provider]  amoxicillin (AMOXIL) 500 MG capsule Take 1 capsule (500 mg total) by mouth 3 (three) times daily. 12/17/18    Sherwood Gambler, MD  benzonatate (TESSALON) 100 MG capsule Take 1 capsule (100 mg total) by mouth every 8 (eight) hours. 01/10/20   Tedd Sias, PA  ondansetron (ZOFRAN ODT) 4 MG disintegrating tablet Take 1 tablet (4 mg total) by mouth every 8 (eight) hours as needed for nausea or vomiting. 11/01/18   Little, Wenda Overland, MD  promethazine-dextromethorphan (PROMETHAZINE-DM) 6.25-15 MG/5ML syrup Take 2.5 mLs by mouth 4 (four) times daily as needed for cough. 01/10/20   Tedd Sias, PA    Allergies    Metronidazole  Review of Systems   Review of Systems  Constitutional: Positive for chills. Negative for fever.  HENT: Negative for congestion.   Eyes: Negative for pain.  Respiratory: Positive for cough and shortness of breath.   Cardiovascular: Positive for chest pain. Negative for leg swelling.  Gastrointestinal: Negative for abdominal pain and vomiting.  Genitourinary: Negative for dysuria.  Musculoskeletal: Negative for myalgias.  Skin: Negative for rash.  Neurological: Negative for dizziness and headaches.    Physical Exam Updated Vital Signs BP 122/82   Pulse 92   Temp 99 F (37.2 C) (Oral)   Resp 16   SpO2 100% Comment: rm air  Physical Exam Vitals and nursing note reviewed.  Constitutional:      General: She is not in acute distress.    Comments: Patient is well-appearing 29 year old  female no acute distress.  She is anxious appearing but speaking in full sentences without difficulty.  HENT:     Head: Normocephalic and atraumatic.     Nose: Nose normal.  Eyes:     General: No scleral icterus. Cardiovascular:     Rate and Rhythm: Normal rate and regular rhythm.     Pulses: Normal pulses.     Heart sounds: Normal heart sounds.  Pulmonary:     Effort: Pulmonary effort is normal. No respiratory distress.     Breath sounds: No wheezing.     Comments: No history of muscle usage.  Faint expiratory wheezes noted. Abdominal:     Palpations: Abdomen is soft.      Tenderness: There is no abdominal tenderness.  Musculoskeletal:     Cervical back: Normal range of motion.     Right lower leg: No edema.     Left lower leg: No edema.  Skin:    General: Skin is warm and dry.     Capillary Refill: Capillary refill takes less than 2 seconds.  Neurological:     Mental Status: She is alert. Mental status is at baseline.  Psychiatric:        Mood and Affect: Mood normal.        Behavior: Behavior normal.     ED Results / Procedures / Treatments   Labs (all labs ordered are listed, but only abnormal results are displayed) Labs Reviewed  COMPREHENSIVE METABOLIC PANEL - Abnormal; Notable for the following components:      Result Value   BUN 5 (*)    All other components within normal limits  CBC WITH DIFFERENTIAL/PLATELET - Abnormal; Notable for the following components:   RBC 5.37 (*)    Hemoglobin 15.1 (*)    All other components within normal limits  D-DIMER, QUANTITATIVE (NOT AT [facility])  TROPONIN I (HIGH SENSITIVITY)    EKG EKG Interpretation  Date/Time:  Saturday Jan 10 2020 16:20:46 EDT Ventricular Rate:  88 PR Interval:    QRS Duration: 83 QT Interval:  358 QTC Calculation: 434 R Axis:   55 Text Interpretation: Sinus rhythm Short PR interval Borderline repolarization abnormality No significant change since last tracing Confirmed by Gwyneth Sprout ([id number]) on 01/10/2020 5:15:44 PM   Radiology DG Chest 2 View  Result Date: 01/10/2020 CLINICAL DATA:  Cough and shortness of breath EXAM: CHEST - 2 VIEW COMPARISON:  Jan 08, 2020. FINDINGS: Lungs are clear. Heart size and pulmonary vascularity are normal. No adenopathy. No pneumothorax. No bone lesions. IMPRESSION: No abnormality noted. Electronically Signed   By: Bretta Bang III M.D.   On: 01/10/2020 15:20    Procedures Procedures (including critical care time)  Medications Ordered in ED Medications  albuterol (VENTOLIN HFA) 108 (90 Base) MCG/ACT inhaler 4 puff (4 puffs Inhalation  Not Given 01/10/20 1453)  predniSONE (DELTASONE) tablet 40 mg (40 mg Oral Given 01/10/20 1506)    ED Course  I have reviewed the triage vital signs and the nursing notes.  Pertinent labs & imaging results that were available during my care of the patient were reviewed by me and considered in my medical decision making (see chart for details).    MDM Rules/Calculators/A&P                      Patient is well-appearing 29 year old female with no pertinent past medical history presented today for chest pain, shortness of breath and cough.  Patient states that she is concerned  for pulmonary movement.  She has been assessed for this several days ago had negative dimer and troponin.  The causes for shortness of breath include but are not limited to Cardiac (AHF, pericardial effusion and tamponade, arrhythmias, ischemia, etc) Respiratory (COPD, asthma, pneumonia, pneumothorax, primary pulmonary hypertension, PE/VQ mismatch) Hematological (anemia) Neuromuscular (ALS, Guillain-Barr, etc)  Patient is PERC negative but however is requesting work-up for PE.  She has had negative dimer several days ago will obtain repeat dimer as her baseline has been established as negative.  Physical exam with clear lungs auscultation.  She does have very mild end expiratory wheeze the patient seems to be causing voluntarily by exhaling forcefully at the end of her breath.  Her oxygen saturation is 100% on room air she is not tachycardic.  She is afebrile and well-appearing.  Suspect bronchitis.  She has already received a albuterol inhaler at her last visit 2 days ago.  I will provide her with antitussive medication in the form of Promethazine DM and benzonatate.  Patient CBC is without leukocytosis or anemia.  She does appear somewhat dehydrated.  Dimer is within normal limits doubt pulmonary embolism.  Troponin is less than 2 and patient has had ongoing symptoms for numerous days at this point doubt cardiac cause of  her chest pain.  CMP without any electrolyte abnormalities or evidence of kidney function loss.  On reassessment patient seems symptomatically improved although she has had no intervention at this time.  Chest x-ray dependently viewed by myself is without any acute abnormality.  EKG is unchanged from prior visit.   Final Clinical Impression(s) / ED Diagnoses Final diagnoses:  Cough  Chest pain, unspecified type    Rx / DC Orders ED Discharge Orders         Ordered    promethazine-dextromethorphan (PROMETHAZINE-DM) 6.25-15 MG/5ML syrup  4 times daily PRN     01/10/20 1610    benzonatate (TESSALON) 100 MG capsule  Every 8 hours     01/10/20 1610           Solon Augusta Winfield, [place] 01/10/20 1751    Pollyann Savoy, MD 01/10/20 1954

## 2020-01-10 NOTE — ED Triage Notes (Signed)
Cough since Tuesday. Seen here for same and given an inhaler. Neg COVID and flu. Given inhaler. Pt reports cough is worsening and she is SOB

## 2020-09-27 ENCOUNTER — Other Ambulatory Visit: Payer: Self-pay

## 2020-09-27 ENCOUNTER — Emergency Department (HOSPITAL_BASED_OUTPATIENT_CLINIC_OR_DEPARTMENT_OTHER)
Admission: EM | Admit: 2020-09-27 | Discharge: 2020-09-27 | Disposition: A | Discharge status: Home / Self Care | Payer: BC Managed Care – PPO

## 2020-09-28 ENCOUNTER — Other Ambulatory Visit: Payer: Self-pay

## 2020-09-28 ENCOUNTER — Encounter (HOSPITAL_BASED_OUTPATIENT_CLINIC_OR_DEPARTMENT_OTHER): Payer: Self-pay | Admitting: Emergency Medicine

## 2020-09-28 ENCOUNTER — Emergency Department (HOSPITAL_BASED_OUTPATIENT_CLINIC_OR_DEPARTMENT_OTHER)
Admission: EM | Admit: 2020-09-28 | Discharge: 2020-09-28 | Disposition: A | Discharge status: Home / Self Care | Payer: Medicaid Other | Attending: Emergency Medicine | Admitting: Emergency Medicine

## 2020-09-28 DIAGNOSIS — N939 Abnormal uterine and vaginal bleeding, unspecified: Secondary | ICD-10-CM | POA: Insufficient documentation

## 2020-09-28 DIAGNOSIS — J45909 Unspecified asthma, uncomplicated: Secondary | ICD-10-CM | POA: Insufficient documentation

## 2020-09-28 DIAGNOSIS — N938 Other specified abnormal uterine and vaginal bleeding: Secondary | ICD-10-CM

## 2020-09-28 LAB — BASIC METABOLIC PANEL
Anion gap: 7 (ref 5–15)
BUN: 6 mg/dL (ref 6–20)
CO2: 23 mmol/L (ref 22–32)
Calcium: 8.8 mg/dL — ABNORMAL LOW (ref 8.9–10.3)
Chloride: 108 mmol/L (ref 98–111)
Creatinine, Ser: 0.78 mg/dL (ref 0.44–1.00)
GFR, Estimated: >60 mL/min (ref >60)
Glucose, Bld: 93 mg/dL (ref 70–99)
Potassium: 3.5 mmol/L (ref 3.5–5.1)
Sodium: 138 mmol/L (ref 135–145)

## 2020-09-28 LAB — CBC WITH DIFFERENTIAL/PLATELET
Abs Immature Granulocytes: 0.03 10*3/uL (ref 0.00–0.07)
Basophils Absolute: 0.1 10*3/uL (ref 0.0–0.1)
Basophils Relative: 1 %
Eosinophils Absolute: 0.1 10*3/uL (ref 0.0–0.5)
Eosinophils Relative: 1 %
HCT: 40.1 % (ref 36.0–46.0)
Hemoglobin: 13.7 g/dL (ref 12.0–15.0)
Immature Granulocytes: 1 %
Lymphocytes Relative: 36 %
Lymphs Abs: 2.4 10*3/uL (ref 0.7–4.0)
MCH: 28.8 pg (ref 26.0–34.0)
MCHC: 34.2 g/dL (ref 30.0–36.0)
MCV: 84.4 fL (ref 80.0–100.0)
Monocytes Absolute: 0.5 10*3/uL (ref 0.1–1.0)
Monocytes Relative: 7 %
Neutro Abs: 3.5 10*3/uL (ref 1.7–7.7)
Neutrophils Relative %: 54 %
Platelets: 322 10*3/uL (ref 150–400)
RBC: 4.75 MIL/uL (ref 3.87–5.11)
RDW: 12.7 % (ref 11.5–15.5)
WBC: 6.5 10*3/uL (ref 4.0–10.5)
nRBC: 0 % (ref 0.0–0.2)

## 2020-09-28 LAB — URINALYSIS, ROUTINE W REFLEX MICROSCOPIC
Bilirubin Urine: NEGATIVE
Glucose, UA: NEGATIVE mg/dL
Ketones, ur: NEGATIVE mg/dL
Leukocytes,Ua: NEGATIVE
Nitrite: NEGATIVE
Protein, ur: NEGATIVE mg/dL
Specific Gravity, Urine: 1.03 (ref 1.005–1.030)
pH: 6 (ref 5.0–8.0)

## 2020-09-28 LAB — PREGNANCY, URINE: Preg Test, Ur: NEGATIVE

## 2020-09-28 LAB — WET PREP, GENITAL
Clue Cells Wet Prep HPF POC: NONE SEEN
Sperm: NONE SEEN
Trich, Wet Prep: NONE SEEN
Yeast Wet Prep HPF POC: NONE SEEN

## 2020-09-28 LAB — URINALYSIS, MICROSCOPIC (REFLEX)

## 2020-09-28 MED ORDER — SODIUM CHLORIDE 0.9 % IV BOLUS
1000.0000 mL | Freq: Once | INTRAVENOUS | Status: AC
  Administered 2020-09-28: 1000 mL via INTRAVENOUS

## 2020-09-28 NOTE — ED Triage Notes (Signed)
States that she came off depo shots in dec  Has beeen having heavy periods and changing tampons she states about every 30 mins

## 2020-09-28 NOTE — ED Provider Notes (Signed)
[facility] EMERGENCY DEPARTMENT Provider Note   CSN: [id number] Arrival date & time: 09/28/20  8841     History Chief Complaint  Patient presents with  . Vaginal Bleeding    Alexis Peters is a 30 y.o. female.  HPI 30 year old female presents with heavy vaginal bleeding.  She skipped her last dose of Depo-Provera, which she has been on for many years, last month.  At the beginning of January she had a menstrual cycle.  However now over the last week she is having much heavier bleeding and associated abdominal cramping.  No urinary symptoms or fevers.  The bleeding is heavy and she is frequently changing tampons, she estimates every 20 minutes.  She is feeling lightheaded and weak.  She denies any concern for STI.   Past Medical History:  Diagnosis Date  . Asthma     There are no problems to display for this patient.   Past Surgical History:  Procedure Laterality Date  . WISDOM TOOTH EXTRACTION       OB History   No obstetric history on file.     No family history on file.  Social History   Tobacco Use  . Smoking status: Never Smoker  . Smokeless tobacco: Never Used  Substance Use Topics  . Alcohol use: No  . Drug use: Yes    Types: Marijuana    Home Medications Prior to Admission medications   Medication Sig Start Date End Date Taking? Authorizing Provider  albuterol (PROVENTIL HFA;VENTOLIN HFA) 108 (90 BASE) MCG/ACT inhaler Inhale into the lungs every 6 (six) hours as needed for wheezing or shortness of breath.    [provider]  amoxicillin (AMOXIL) 500 MG capsule Take 1 capsule (500 mg total) by mouth 3 (three) times daily. 12/17/18   Pricilla Loveless, MD  benzonatate (TESSALON) 100 MG capsule Take 1 capsule (100 mg total) by mouth every 8 (eight) hours. 01/10/20   Gailen Shelter, PA  ondansetron (ZOFRAN ODT) 4 MG disintegrating tablet Take 1 tablet (4 mg total) by mouth every 8 (eight) hours as needed for nausea or vomiting. 11/01/18    Little, Ambrose Finland, MD  promethazine-dextromethorphan (PROMETHAZINE-DM) 6.25-15 MG/5ML syrup Take 2.5 mLs by mouth 4 (four) times daily as needed for cough. 01/10/20   Gailen Shelter, PA    Allergies    Metronidazole  Review of Systems   Review of Systems  Gastrointestinal: Positive for abdominal pain. Negative for vomiting.  Genitourinary: Positive for vaginal bleeding.  Neurological: Positive for light-headedness.  All other systems reviewed and are negative.   Physical Exam Updated Vital Signs BP 122/77 (BP Location: Left Arm)   Pulse 76   Temp 98.6 F (37 C) (Oral)   Resp 16   Ht 5' (1.524 m)   Wt 51.7 kg   SpO2 100%   BMI 22.26 kg/m   Physical Exam Vitals and nursing note reviewed. Exam conducted with a chaperone present.  Constitutional:      Appearance: She is well-developed and well-nourished.  HENT:     Head: Normocephalic and atraumatic.     Right Ear: External ear normal.     Left Ear: External ear normal.     Nose: Nose normal.  Eyes:     General:        Right eye: No discharge.        Left eye: No discharge.  Cardiovascular:     Rate and Rhythm: Normal rate and regular rhythm.     Heart  sounds: Normal heart sounds.  Pulmonary:     Effort: Pulmonary effort is normal.     Breath sounds: Normal breath sounds.  Abdominal:     General: There is no distension.     Palpations: Abdomen is soft.     Tenderness: There is no abdominal tenderness.  Genitourinary:    Vagina: Bleeding present.     Cervix: Cervical bleeding present. No discharge.     Comments: There is some mild bleeding that appears to be coming from the cervix Skin:    General: Skin is warm and dry.  Neurological:     Mental Status: She is alert.  Psychiatric:        Mood and Affect: Mood is not anxious.     ED Results / Procedures / Treatments   Labs (all labs ordered are listed, but only abnormal results are displayed) Labs Reviewed  WET PREP, GENITAL - Abnormal; Notable for  the following components:      Result Value   WBC, Wet Prep HPF POC MODERATE (*)    All other components within normal limits  URINALYSIS, ROUTINE W REFLEX MICROSCOPIC - Abnormal; Notable for the following components:   Hgb urine dipstick SMALL (*)    All other components within normal limits  URINALYSIS, MICROSCOPIC (REFLEX) - Abnormal; Notable for the following components:   Bacteria, UA RARE (*)    All other components within normal limits  BASIC METABOLIC PANEL - Abnormal; Notable for the following components:   Calcium 8.8 (*)    All other components within normal limits  PREGNANCY, URINE  CBC WITH DIFFERENTIAL/PLATELET  GC/CHLAMYDIA PROBE AMP ([place]) NOT AT [facility]    EKG None  Radiology No results found.  Procedures Procedures   Medications Ordered in ED Medications  sodium chloride 0.9 % bolus 1,000 mL (0 mLs Intravenous Stopped 09/28/20 1259)    ED Course  I have reviewed the triage vital signs and the nursing notes.  Pertinent labs & imaging results that were available during my care of the patient were reviewed by me and considered in my medical decision making (see chart for details).    MDM Rules/Calculators/A&P                          Patient's labs are reassuring including normal hemoglobin.  Otherwise, pelvic exam is benign.  No concern for STI from patient but will send swabs.  Otherwise needs to follow-up with her GYN. Final Clinical Impression(s) / ED Diagnoses Final diagnoses:  Dysfunctional uterine bleeding    Rx / DC Orders ED Discharge Orders    None       Pricilla Loveless, MD 09/28/20 1547

## 2020-09-29 LAB — GC/CHLAMYDIA PROBE AMP (~~LOC~~) NOT AT ARMC
Chlamydia: NEGATIVE
Comment: NEGATIVE
Comment: NORMAL
Neisseria Gonorrhea: NEGATIVE

## 2021-02-26 ENCOUNTER — Emergency Department (HOSPITAL_BASED_OUTPATIENT_CLINIC_OR_DEPARTMENT_OTHER)
Admission: EM | Admit: 2021-02-26 | Discharge: 2021-02-26 | Disposition: A | Discharge status: Home / Self Care | Payer: Medicaid Other | Attending: Emergency Medicine | Admitting: Emergency Medicine

## 2021-02-26 ENCOUNTER — Other Ambulatory Visit: Payer: Self-pay

## 2021-02-26 ENCOUNTER — Encounter (HOSPITAL_BASED_OUTPATIENT_CLINIC_OR_DEPARTMENT_OTHER): Payer: Self-pay | Admitting: Emergency Medicine

## 2021-02-26 DIAGNOSIS — J45909 Unspecified asthma, uncomplicated: Secondary | ICD-10-CM | POA: Insufficient documentation

## 2021-02-26 DIAGNOSIS — Z3A00 Weeks of gestation of pregnancy not specified: Secondary | ICD-10-CM | POA: Insufficient documentation

## 2021-02-26 DIAGNOSIS — O99519 Diseases of the respiratory system complicating pregnancy, unspecified trimester: Secondary | ICD-10-CM | POA: Insufficient documentation

## 2021-02-26 DIAGNOSIS — Z3490 Encounter for supervision of normal pregnancy, unspecified, unspecified trimester: Secondary | ICD-10-CM

## 2021-02-26 DIAGNOSIS — O210 Mild hyperemesis gravidarum: Secondary | ICD-10-CM | POA: Insufficient documentation

## 2021-02-26 LAB — BASIC METABOLIC PANEL
Anion gap: 15 (ref 5–15)
BUN: 17 mg/dL (ref 6–20)
CO2: 23 mmol/L (ref 22–32)
Calcium: 10 mg/dL (ref 8.9–10.3)
Chloride: 99 mmol/L (ref 98–111)
Creatinine, Ser: 0.98 mg/dL (ref 0.44–1.00)
GFR, Estimated: >60 mL/min (ref >60)
Glucose, Bld: 120 mg/dL — ABNORMAL HIGH (ref 70–99)
Potassium: 2.9 mmol/L — ABNORMAL LOW (ref 3.5–5.1)
Sodium: 137 mmol/L (ref 135–145)

## 2021-02-26 LAB — CBC WITH DIFFERENTIAL/PLATELET
Abs Immature Granulocytes: 0.06 10*3/uL (ref 0.00–0.07)
Basophils Absolute: 0.1 10*3/uL (ref 0.0–0.1)
Basophils Relative: 1 %
Eosinophils Absolute: 0 10*3/uL (ref 0.0–0.5)
Eosinophils Relative: 0 %
HCT: 47.2 % — ABNORMAL HIGH (ref 36.0–46.0)
Hemoglobin: 17 g/dL — ABNORMAL HIGH (ref 12.0–15.0)
Immature Granulocytes: 0 %
Lymphocytes Relative: 15 %
Lymphs Abs: 2 10*3/uL (ref 0.7–4.0)
MCH: 29.2 pg (ref 26.0–34.0)
MCHC: 36 g/dL (ref 30.0–36.0)
MCV: 81 fL (ref 80.0–100.0)
Monocytes Absolute: 1.2 10*3/uL — ABNORMAL HIGH (ref 0.1–1.0)
Monocytes Relative: 9 %
Neutro Abs: 10.2 10*3/uL — ABNORMAL HIGH (ref 1.7–7.7)
Neutrophils Relative %: 75 %
Platelets: 551 10*3/uL — ABNORMAL HIGH (ref 150–400)
RBC: 5.83 MIL/uL — ABNORMAL HIGH (ref 3.87–5.11)
RDW: 12 % (ref 11.5–15.5)
WBC: 13.6 10*3/uL — ABNORMAL HIGH (ref 4.0–10.5)
nRBC: 0 % (ref 0.0–0.2)

## 2021-02-26 LAB — URINALYSIS, MICROSCOPIC (REFLEX)

## 2021-02-26 LAB — URINALYSIS, ROUTINE W REFLEX MICROSCOPIC
Glucose, UA: NEGATIVE mg/dL
Ketones, ur: >80 mg/dL — AB
Leukocytes,Ua: NEGATIVE
Nitrite: NEGATIVE
Protein, ur: 100 mg/dL — AB
Specific Gravity, Urine: >1.03 — ABNORMAL HIGH (ref 1.005–1.030)
pH: 5.5 (ref 5.0–8.0)

## 2021-02-26 LAB — PREGNANCY, URINE: Preg Test, Ur: POSITIVE — AB

## 2021-02-26 MED ORDER — METOCLOPRAMIDE HCL 5 MG/ML IJ SOLN
10.0000 mg | Freq: Once | INTRAMUSCULAR | Status: AC
  Administered 2021-02-26: 19:00:00 10 mg via INTRAVENOUS
  Filled 2021-02-26: qty 2

## 2021-02-26 MED ORDER — SODIUM CHLORIDE 0.9 % IV BOLUS
1000.0000 mL | Freq: Once | INTRAVENOUS | Status: AC
  Administered 2021-02-26: 19:00:00 1000 mL via INTRAVENOUS

## 2021-02-26 MED ORDER — SODIUM CHLORIDE 0.9 % IV BOLUS
1000.0000 mL | Freq: Once | INTRAVENOUS | Status: AC
  Administered 2021-02-26: 20:00:00 1000 mL via INTRAVENOUS

## 2021-02-26 NOTE — ED Notes (Signed)
Attempting to provide urine specimen.  

## 2021-02-26 NOTE — ED Triage Notes (Signed)
Reports n/v for the last four days.  Positive pregnancy test three days ago.

## 2021-02-26 NOTE — ED Provider Notes (Signed)
[facility] EMERGENCY DEPARTMENT Provider Note   CSN: [id number] Arrival date & time: 02/26/21  1745     History Chief Complaint  Patient presents with   Morning Sickness    Alexis Peters is a 30 y.o. female.  30 year old G1P0 with positive home pregnancy test 3 days ago presenting with nausea and vomiting x 4 days. Associated with some lower abdominal pain. Reports increased vaginal discharge, but no vaginal bleeding.  Denies fever, chills, dysuria, or diarrhea. No CP or SOB.  No previous abdominal surgeries reported.  Has not taken anything OTC for her symptoms. PHM includes asthma, otherwise no medical issues or medications on a regular basis. Symptoms have been uncontrolled prompting ED visit today.  LMP 01/03/2021.        Past Medical History:  Diagnosis Date   Asthma     There are no problems to display for this patient.   Past Surgical History:  Procedure Laterality Date   WISDOM TOOTH EXTRACTION       OB History   No obstetric history on file.     No family history on file.  Social History   Tobacco Use   Smoking status: Never   Smokeless tobacco: Never  Substance Use Topics   Alcohol use: No   Drug use: Yes    Types: Marijuana    Home Medications Prior to Admission medications   Medication Sig Start Date End Date Taking? Authorizing Provider  albuterol (PROVENTIL HFA;VENTOLIN HFA) 108 (90 BASE) MCG/ACT inhaler Inhale into the lungs every 6 (six) hours as needed for wheezing or shortness of breath.    [provider]  amoxicillin (AMOXIL) 500 MG capsule Take 1 capsule (500 mg total) by mouth 3 (three) times daily. 12/17/18   Pricilla Loveless, MD  benzonatate (TESSALON) 100 MG capsule Take 1 capsule (100 mg total) by mouth every 8 (eight) hours. 01/10/20   Gailen Shelter, PA  ondansetron (ZOFRAN ODT) 4 MG disintegrating tablet Take 1 tablet (4 mg total) by mouth every 8 (eight) hours as needed for nausea or vomiting. 11/01/18   Little,  Ambrose Finland, MD  promethazine-dextromethorphan (PROMETHAZINE-DM) 6.25-15 MG/5ML syrup Take 2.5 mLs by mouth 4 (four) times daily as needed for cough. 01/10/20   Gailen Shelter, PA    Allergies    Metronidazole  Review of Systems   Review of Systems  Constitutional:  Negative for fever.  HENT:  Negative for rhinorrhea and sore throat.   Eyes:  Negative for redness.  Respiratory:  Negative for cough.   Cardiovascular:  Negative for chest pain.  Gastrointestinal:  Positive for abdominal pain, nausea and vomiting. Negative for diarrhea.  Genitourinary:  Positive for vaginal discharge. Negative for dysuria, frequency, hematuria, urgency and vaginal bleeding.  Musculoskeletal:  Negative for myalgias.  Skin:  Negative for rash.  Neurological:  Negative for headaches.   Physical Exam Updated Vital Signs BP (!) 129/92 (BP Location: Left Arm)   Pulse (!) 121   Temp 98.1 F (36.7 C) (Oral)   Resp 18   Ht 5' (1.524 m)   Wt 51.7 kg   LMP 01/03/2021   SpO2 95%   BMI 22.26 kg/m   Physical Exam Vitals and nursing note reviewed.  Constitutional:      General: She is in acute distress (leaning forward dry heaving when entering room).     Appearance: She is well-developed.  HENT:     Head: Normocephalic and atraumatic.     Right Ear: External ear  normal.     Left Ear: External ear normal.     Nose: Nose normal.  Eyes:     Conjunctiva/sclera: Conjunctivae normal.  Cardiovascular:     Rate and Rhythm: Normal rate and regular rhythm.     Heart sounds: No murmur heard. Pulmonary:     Effort: No respiratory distress.     Breath sounds: No wheezing, rhonchi or rales.  Abdominal:     Palpations: Abdomen is soft.     Tenderness: There is no abdominal tenderness. There is no guarding or rebound.     Comments: No focal abdominal tenderness. Abd soft.   Musculoskeletal:     Cervical back: Normal range of motion and neck supple.     Right lower leg: No edema.     Left lower leg: No  edema.  Skin:    General: Skin is warm and dry.     Findings: No rash.  Neurological:     General: No focal deficit present.     Mental Status: She is alert. Mental status is at baseline.     Motor: No weakness.  Psychiatric:        Mood and Affect: Mood normal.    ED Results / Procedures / Treatments   Labs (all labs ordered are listed, but only abnormal results are displayed) Labs Reviewed  CBC WITH DIFFERENTIAL/PLATELET - Abnormal; Notable for the following components:      Result Value   WBC 13.6 (*)    RBC 5.83 (*)    Hemoglobin 17.0 (*)    HCT 47.2 (*)    Platelets 551 (*)    Neutro Abs 10.2 (*)    Monocytes Absolute 1.2 (*)    All other components within normal limits  BASIC METABOLIC PANEL - Abnormal; Notable for the following components:   Potassium 2.9 (*)    Glucose, Bld 120 (*)    All other components within normal limits  URINALYSIS, ROUTINE W REFLEX MICROSCOPIC - Abnormal; Notable for the following components:   Color, Urine AMBER (*)    Specific Gravity, Urine >1.030 (*)    Hgb urine dipstick TRACE (*)    Bilirubin Urine MODERATE (*)    Ketones, ur >80 (*)    Protein, ur 100 (*)    All other components within normal limits  PREGNANCY, URINE - Abnormal; Notable for the following components:   Preg Test, Ur POSITIVE (*)    All other components within normal limits  URINALYSIS, MICROSCOPIC (REFLEX) - Abnormal; Notable for the following components:   Bacteria, UA MANY (*)    All other components within normal limits    EKG None  Radiology No results found.  Procedures Procedures   Medications Ordered in ED Medications  sodium chloride 0.9 % bolus 1,000 mL (0 mLs Intravenous Stopped 02/26/21 1930)  metoCLOPramide (REGLAN) injection 10 mg (10 mg Intravenous Given 02/26/21 1831)  sodium chloride 0.9 % bolus 1,000 mL (1,000 mLs Intravenous New Bag/Given 02/26/21 1933)    ED Course  I have reviewed the triage vital signs and the nursing  notes.  Pertinent labs & imaging results that were available during my care of the patient were reviewed by me and considered in my medical decision making (see chart for details).  Patient seen and examined.  Patient with nausea vomiting in setting of presumptive early pregnancy.  Will check labs, hydrate, give antiemetics, and confirm pregnancy. Symptoms are not currently controlled.   Vital signs reviewed and are as follows: BP Marland Kitchen)  129/92 (BP Location: Left Arm)   Pulse (!) 121   Temp 98.1 F (36.7 C) (Oral)   Resp 18   Ht 5' (1.524 m)   Wt 51.7 kg   LMP 01/03/2021   SpO2 95%   BMI 22.26 kg/m   7:32 PM labs appear to be hemoconcentrated.  Patient be given a second liter fluid.  On recheck, nausea is much improved after IV antiemetics.  Will p.o. challenge.  9:28 PM patient improved with IV fluids and antiemetics.  She has been able to tolerate some sips of water in the room.  Abdomen soft and nontender on exam.  Plan for discharge to home.  Will provide prescription for Reglan and also given instructions on doxylamine and B6.  The patient was urged to return to the Emergency Department immediately with worsening of current symptoms, worsening abdominal pain, persistent vomiting, blood noted in stools, fever, or any other concerns. The patient verbalized understanding.   BP 122/71   Pulse (!) 101   Temp 98.1 F (36.7 C) (Oral)   Resp 17   Ht 5' (1.524 m)   Wt 51.7 kg   LMP 01/03/2021   SpO2 100%   BMI 22.26 kg/m     MDM Rules/Calculators/A&P                          Patient with hyperemesis gravidarum, improved in ED with treatment.  Labs are reassuring but do showed signs of hydration.  She was hydrated with 2 L normal saline and given antiemetics which improved her symptoms. Patient without significant abdominal pain. Vitals are stable, no fever. Lungs are clear and no signs suggestive of PNA. Low concern for appendicitis, cholecystitis, pancreatitis, ruptured viscus,  UTI, kidney stone, aortic dissection, aortic aneurysm, ectopic pregnancy or other emergent abdominal etiology. No vaginal bleeding.  Supportive therapy indicated with return if symptoms worsen.      Final Clinical Impression(s) / ED Diagnoses Final diagnoses:  Hyperemesis gravidarum  Pregnancy, unspecified gestational age    Rx / DC Orders ED Discharge Orders     None        Renne Crigler, Cordelia Poche 02/26/21 2131    Little, Ambrose Finland, MD 02/26/21 2321

## 2021-02-26 NOTE — Discharge Instructions (Signed)
Please read and follow all provided instructions.  Your diagnoses today include:  1. Hyperemesis gravidarum   2. Pregnancy, unspecified gestational age     Tests performed today include: Blood cell counts (white, red, and platelets) - shows signs of dehydration Electrolytes  Kidney function test Urine test to check for infection - shows signs of dehydration Pregnancy test is positive Vital signs. See below for your results today.   Medications prescribed:  Reglan - prescription medicine for nausea and vomiting  You may also use doxylamine, available as an over-the-counter sleeping pills (eg, Unisom Sleep Tabs): One-half of the 25 mg over-the-counter tablet twice a day for vomiting. In addition, pyridoxine 25 mg (Vitamin B6), also available over-the-counter, is taken three or four times per day for vomiting.   Take any prescribed medications only as directed.  Home care instructions:  Follow any educational materials contained in this packet.  BE VERY CAREFUL not to take multiple medicines containing Tylenol (also called acetaminophen). Doing so can lead to an overdose which can damage your liver and cause liver failure and possibly death.   Follow-up instructions: Please follow-up with your primary care provider or OB/GYN in the next 3 days for further evaluation of your symptoms.   Return instructions:  Please return to the Emergency Department if you experience worsening symptoms.  Please return if you have any other emergent concerns.  Additional Information:  Your vital signs today were: BP 122/71   Pulse (!) 101   Temp 98.1 F (36.7 C) (Oral)   Resp 17   Ht 5' (1.524 m)   Wt 51.7 kg   LMP 01/03/2021   SpO2 100%   BMI 22.26 kg/m  If your blood pressure (BP) was elevated above 135/85 this visit, please have this repeated by your doctor within one month. --------------

## 2021-05-21 ENCOUNTER — Other Ambulatory Visit: Payer: Self-pay

## 2021-05-21 ENCOUNTER — Encounter (HOSPITAL_BASED_OUTPATIENT_CLINIC_OR_DEPARTMENT_OTHER): Payer: Self-pay | Admitting: Emergency Medicine

## 2021-05-21 ENCOUNTER — Emergency Department (HOSPITAL_BASED_OUTPATIENT_CLINIC_OR_DEPARTMENT_OTHER)
Admission: EM | Admit: 2021-05-21 | Discharge: 2021-05-21 | Disposition: A | Discharge status: Home / Self Care | Payer: No Typology Code available for payment source | Attending: Emergency Medicine | Admitting: Emergency Medicine

## 2021-05-21 DIAGNOSIS — M7918 Myalgia, other site: Secondary | ICD-10-CM

## 2021-05-21 DIAGNOSIS — M542 Cervicalgia: Secondary | ICD-10-CM | POA: Insufficient documentation

## 2021-05-21 DIAGNOSIS — M546 Pain in thoracic spine: Secondary | ICD-10-CM | POA: Insufficient documentation

## 2021-05-21 DIAGNOSIS — M25511 Pain in right shoulder: Secondary | ICD-10-CM | POA: Diagnosis not present

## 2021-05-21 DIAGNOSIS — M25512 Pain in left shoulder: Secondary | ICD-10-CM | POA: Diagnosis not present

## 2021-05-21 DIAGNOSIS — R519 Headache, unspecified: Secondary | ICD-10-CM | POA: Insufficient documentation

## 2021-05-21 DIAGNOSIS — Y92410 Unspecified street and highway as the place of occurrence of the external cause: Secondary | ICD-10-CM | POA: Diagnosis not present

## 2021-05-21 DIAGNOSIS — J45909 Unspecified asthma, uncomplicated: Secondary | ICD-10-CM | POA: Diagnosis not present

## 2021-05-21 DIAGNOSIS — M791 Myalgia, unspecified site: Secondary | ICD-10-CM | POA: Insufficient documentation

## 2021-05-21 MED ORDER — METHOCARBAMOL 500 MG PO TABS: 1000.0000 mg | ORAL_TABLET | Freq: Four times a day (QID) | ORAL | 0 refills | Status: DC

## 2021-05-21 MED ORDER — KETOROLAC TROMETHAMINE 60 MG/2ML IM SOLN
30.0000 mg | Freq: Once | INTRAMUSCULAR | Status: AC
  Administered 2021-05-21: 30 mg via INTRAMUSCULAR
  Filled 2021-05-21: qty 2
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
## 2021-07-19 ENCOUNTER — Encounter (HOSPITAL_BASED_OUTPATIENT_CLINIC_OR_DEPARTMENT_OTHER): Payer: Self-pay | Admitting: Emergency Medicine

## 2021-07-19 ENCOUNTER — Emergency Department (HOSPITAL_BASED_OUTPATIENT_CLINIC_OR_DEPARTMENT_OTHER)
Admission: EM | Admit: 2021-07-19 | Discharge: 2021-07-19 | Disposition: A | Discharge status: Home / Self Care | Payer: Medicaid Other | Attending: Emergency Medicine | Admitting: Emergency Medicine

## 2021-07-19 ENCOUNTER — Other Ambulatory Visit: Payer: Self-pay

## 2021-07-19 DIAGNOSIS — N3001 Acute cystitis with hematuria: Secondary | ICD-10-CM | POA: Insufficient documentation

## 2021-07-19 DIAGNOSIS — J45909 Unspecified asthma, uncomplicated: Secondary | ICD-10-CM | POA: Insufficient documentation

## 2021-07-19 LAB — URINALYSIS, MICROSCOPIC (REFLEX): WBC, UA: >50 WBC/hpf (ref 0–5)

## 2021-07-19 LAB — URINALYSIS, ROUTINE W REFLEX MICROSCOPIC
Bilirubin Urine: NEGATIVE
Glucose, UA: NEGATIVE mg/dL
Ketones, ur: NEGATIVE mg/dL
Nitrite: NEGATIVE
Protein, ur: NEGATIVE mg/dL
Specific Gravity, Urine: 1.01 (ref 1.005–1.030)
pH: 6.5 (ref 5.0–8.0)

## 2021-07-19 LAB — PREGNANCY, URINE: Preg Test, Ur: NEGATIVE

## 2021-07-19 MED ORDER — CEPHALEXIN 250 MG PO CAPS
500.0000 mg | ORAL_CAPSULE | Freq: Once | ORAL | Status: AC
  Administered 2021-07-19: 500 mg via ORAL
  Filled 2021-07-19: qty 2

## 2021-07-19 MED ORDER — IBUPROFEN 400 MG PO TABS
400.0000 mg | ORAL_TABLET | Freq: Once | ORAL | Status: AC
  Administered 2021-07-19: 400 mg via ORAL
  Filled 2021-07-19: qty 1

## 2021-07-19 MED ORDER — CEPHALEXIN 500 MG PO CAPS: 500.0000 mg | ORAL_CAPSULE | Freq: Three times a day (TID) | ORAL | 0 refills | Status: DC

## 2021-07-19 NOTE — ED Provider Notes (Signed)
[facility] EMERGENCY DEPARTMENT Provider Note   CSN: [id number] Arrival date & time: 07/19/21  0320     History Chief Complaint  Patient presents with   Urinary Frequency    Alexis Peters is a 30 y.o. female.  The history is provided by the patient.  Urinary Frequency This is a new problem. The current episode started 2 days ago. The problem occurs daily. The problem has been gradually worsening. Associated symptoms include abdominal pain. Pertinent negatives include no chest pain. Exacerbated by: urination. The symptoms are relieved by rest.  Patient reports urinary frequency with decreased urine output for past 2 days. She reports he is now having mild abdominal pain. No fevers or vomiting.  No other acute complaints    Past Medical History:  Diagnosis Date   Asthma     There are no problems to display for this patient.   Past Surgical History:  Procedure Laterality Date   WISDOM TOOTH EXTRACTION       OB History   No obstetric history on file.     History reviewed. No pertinent family history.  Social History   Tobacco Use   Smoking status: Never   Smokeless tobacco: Never  Substance Use Topics   Alcohol use: No   Drug use: Yes    Types: Marijuana    Home Medications Prior to Admission medications   Medication Sig Start Date End Date Taking? Authorizing Provider  cephALEXin (KEFLEX) 500 MG capsule Take 1 capsule (500 mg total) by mouth 3 (three) times daily. 07/19/21  Yes Zadie Rhine, MD  albuterol (PROVENTIL HFA;VENTOLIN HFA) 108 (90 BASE) MCG/ACT inhaler Inhale into the lungs every 6 (six) hours as needed for wheezing or shortness of breath.    [provider]    Allergies    Metronidazole  Review of Systems   Review of Systems  Constitutional:  Negative for appetite change and fever.  Cardiovascular:  Negative for chest pain.  Gastrointestinal:  Positive for abdominal pain. Negative for vomiting.  Genitourinary:   Positive for decreased urine volume and frequency. Negative for dysuria, flank pain and vaginal discharge.  All other systems reviewed and are negative.  Physical Exam Updated Vital Signs BP 133/90   Pulse (!) 104   Temp 98.5 F (36.9 C) (Oral)   Resp 16   Ht 1.524 m (5')   Wt 54 kg   LMP 07/16/2021 (Exact Date)   SpO2 100%   BMI 23.24 kg/m   Physical Exam CONSTITUTIONAL: Well developed/well nourished HEAD: Normocephalic/atraumatic EYES: EOMI ENMT: Mucous membranes moist NECK: supple no meningeal signs SPINE/BACK:entire spine nontender CV: S1/S2 noted, no murmurs/rubs/gallops noted LUNGS: Lungs are clear to auscultation bilaterally, no apparent distress ABDOMEN: soft, mild left mid abdominal tenderness, no rebound or guarding, bowel sounds noted throughout abdomen GU:no cva tenderness NEURO: Pt is awake/alert/appropriate, moves all extremitiesx4.  No facial droop.   EXTREMITIES: pulses normal/equal, full ROM SKIN: warm, color normal PSYCH: no abnormalities of mood noted, alert and oriented to situation  ED Results / Procedures / Treatments   Labs (all labs ordered are listed, but only abnormal results are displayed) Labs Reviewed  URINALYSIS, ROUTINE W REFLEX MICROSCOPIC - Abnormal; Notable for the following components:      Result Value   APPearance HAZY (*)    Hgb urine dipstick MODERATE (*)    Leukocytes,Ua MODERATE (*)    All other components within normal limits  URINALYSIS, MICROSCOPIC (REFLEX) - Abnormal; Notable for the following components:  Bacteria, UA MANY (*)    Non Squamous Epithelial PRESENT (*)    All other components within normal limits  PREGNANCY, URINE    EKG None  Radiology No results found.  Procedures Procedures   Medications Ordered in ED Medications  cephALEXin (KEFLEX) capsule 500 mg (500 mg Oral Given 07/19/21 0425)  ibuprofen (ADVIL) tablet 400 mg (400 mg Oral Given 07/19/21 0424)    ED Course  I have reviewed the triage  vital signs and the nursing notes.  Pertinent labs results that were available during my care of the patient were reviewed by me and considered in my medical decision making (see chart for details).    MDM Rules/Calculators/A&P                           Patient history consistent with UTI.  Lab results also consistent with UTI.  She is not septic appearing.  She is afebrile.  Will start on Keflex.  We discussed strict return precautions Patient has no pelvic complaints. Final Clinical Impression(s) / ED Diagnoses Final diagnoses:  Acute cystitis with hematuria    Rx / DC Orders ED Discharge Orders          Ordered    cephALEXin (KEFLEX) 500 MG capsule  3 times daily        07/19/21 0420             Zadie Rhine, MD 07/19/21 0451

## 2021-07-19 NOTE — ED Triage Notes (Signed)
Pt c/o left sided abd pain with frequent urination.

## 2021-08-14 IMAGING — CR DG CHEST 2V
2 series · 2 of 2 positions shown · non-contrast
Comparison: January 08, 2020.

CLINICAL DATA: Cough and shortness of breath

EXAM:
CHEST - 2 VIEW

[w chest pa]
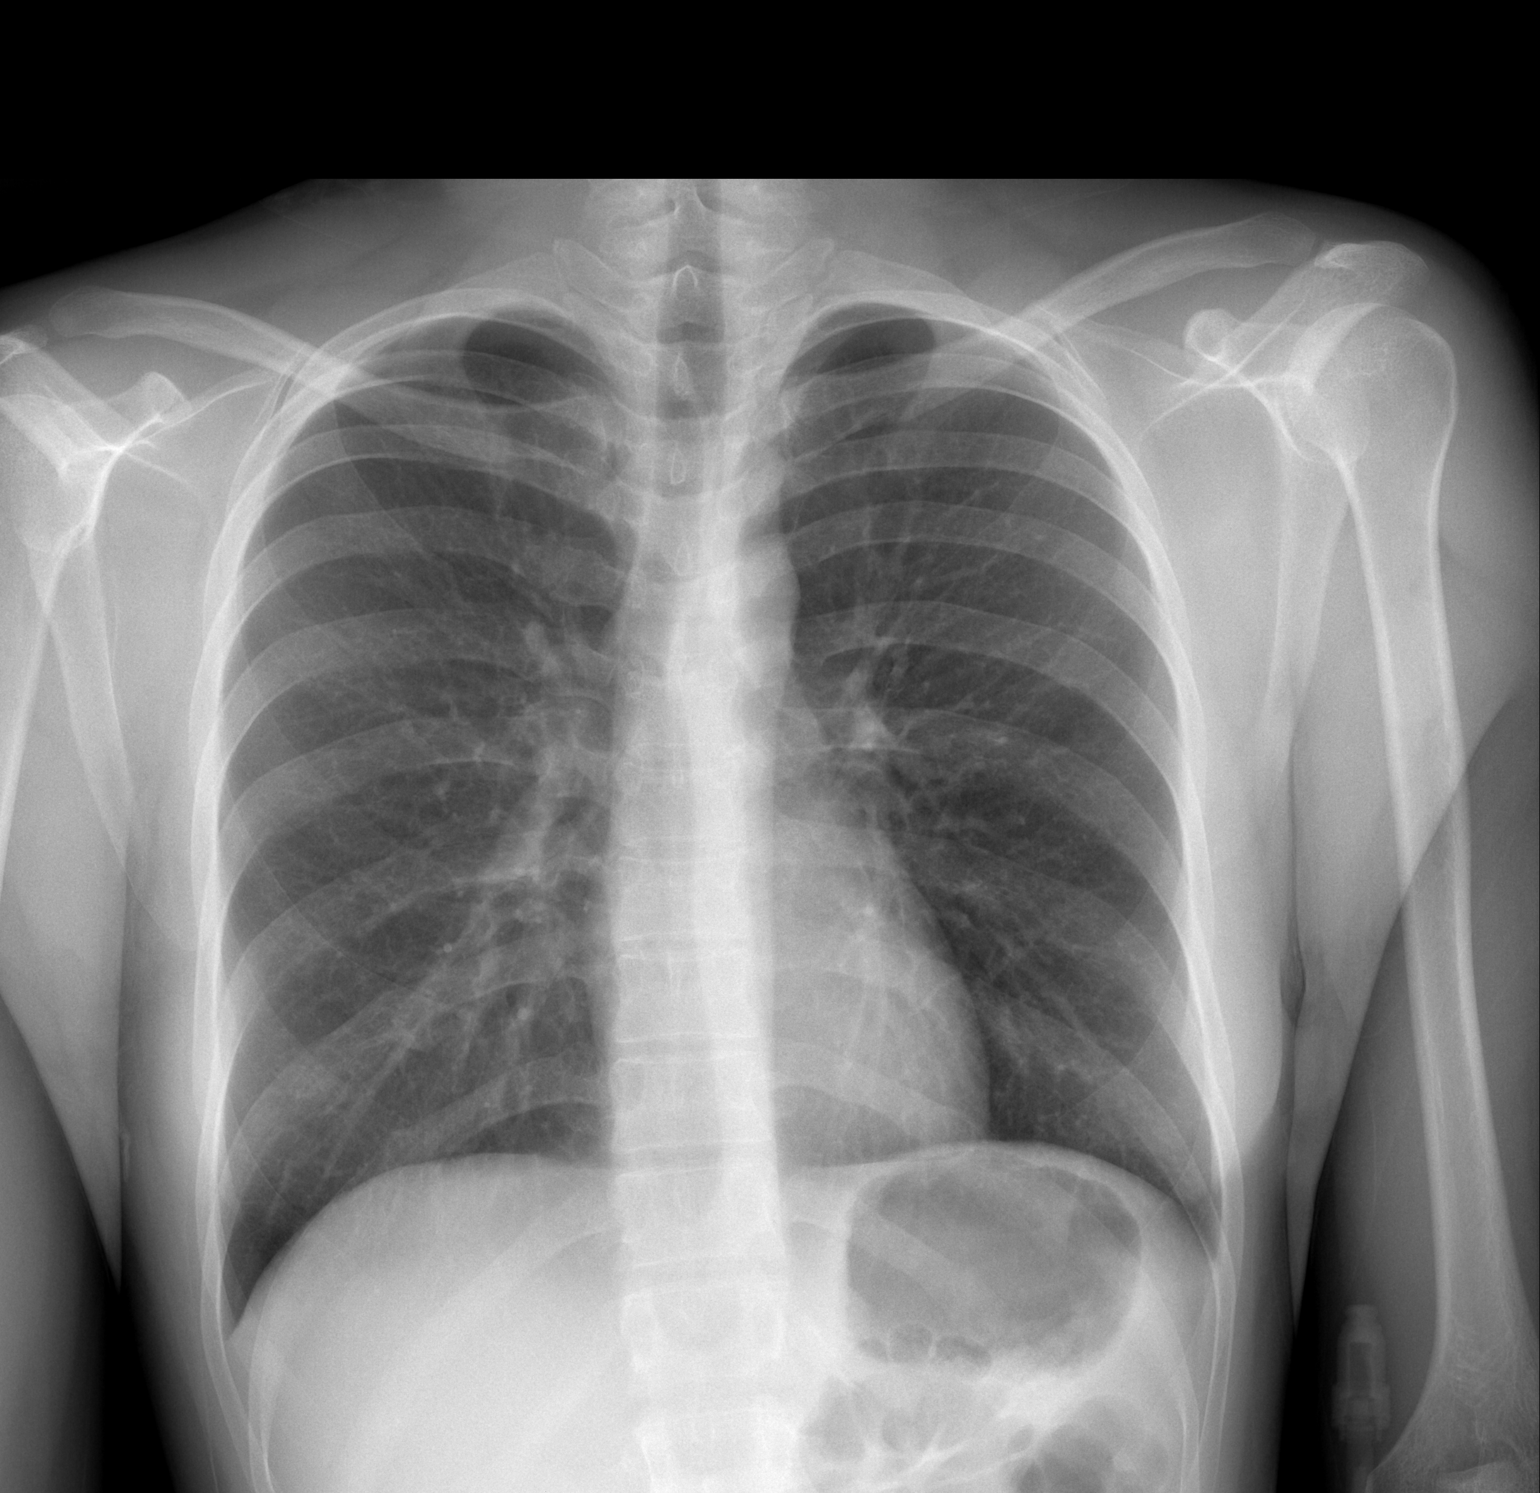

[w chest lat]
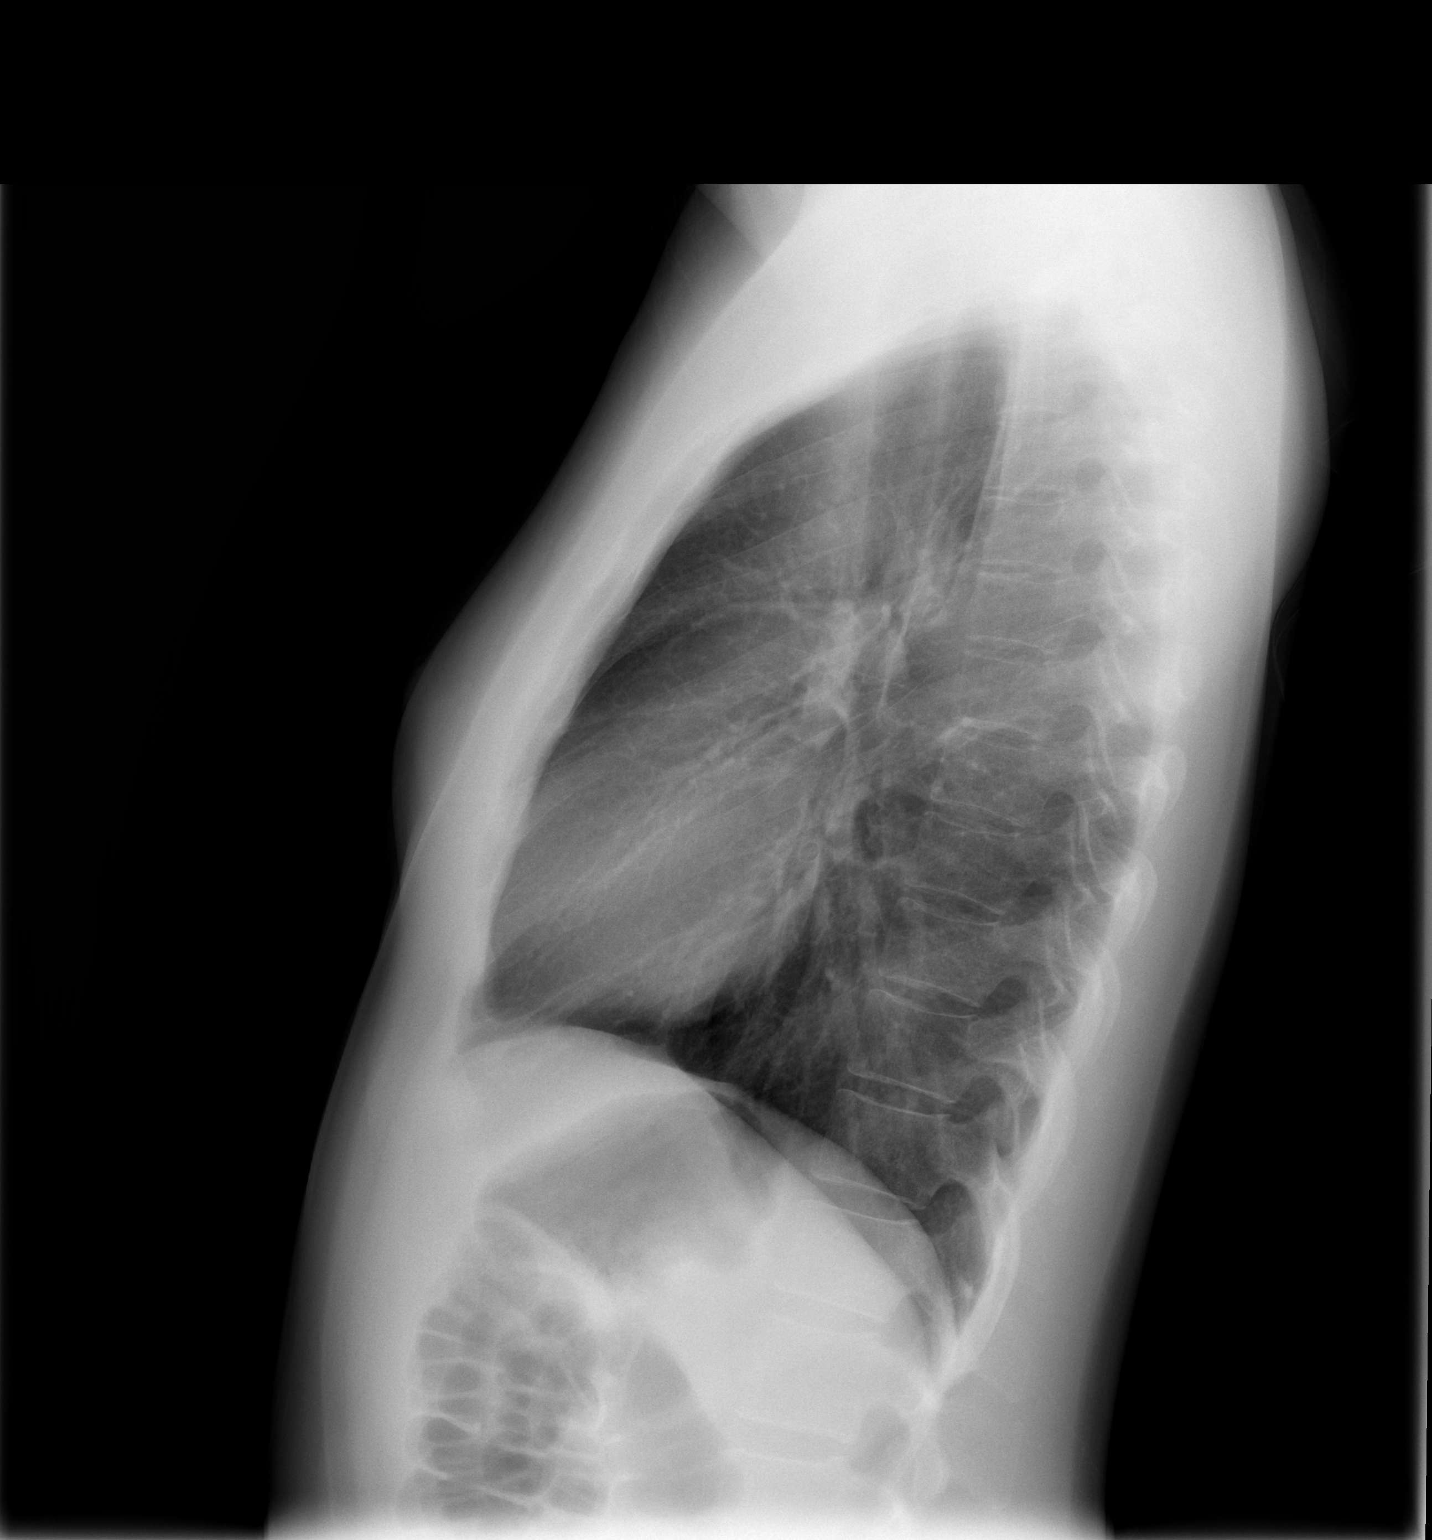

[2 of 2 positions shown; findings below may reference images not displayed]

FINDINGS: Lungs are clear. Heart size and pulmonary vascularity are normal. No
adenopathy. No pneumothorax. No bone lesions.
IMPRESSION: No abnormality noted.

## 2021-09-21 ENCOUNTER — Emergency Department (HOSPITAL_BASED_OUTPATIENT_CLINIC_OR_DEPARTMENT_OTHER)
Admission: EM | Admit: 2021-09-21 | Discharge: 2021-09-21 | Disposition: A | Discharge status: Home / Self Care | Payer: Medicaid Other | Attending: Emergency Medicine | Admitting: Emergency Medicine

## 2021-09-21 ENCOUNTER — Other Ambulatory Visit: Payer: Self-pay

## 2021-09-21 ENCOUNTER — Encounter (HOSPITAL_BASED_OUTPATIENT_CLINIC_OR_DEPARTMENT_OTHER): Payer: Self-pay | Admitting: Emergency Medicine

## 2021-09-21 DIAGNOSIS — Z202 Contact with and (suspected) exposure to infections with a predominantly sexual mode of transmission: Secondary | ICD-10-CM

## 2021-09-21 LAB — WET PREP, GENITAL
Sperm: NONE SEEN
Trich, Wet Prep: NONE SEEN
WBC, Wet Prep HPF POC: <10 (ref <10)
Yeast Wet Prep HPF POC: NONE SEEN

## 2021-09-21 NOTE — ED Provider Notes (Signed)
[facility] EMERGENCY DEPARTMENT Provider Note   CSN: [id number] Arrival date & time: 09/21/21  0151     History  Chief Complaint  Patient presents with   Exposure to STD    Alexis Peters is a 31 y.o. female.  No symptoms. States she might have been exposed to an STD and wants tested for GC/Chlam. Specifically doesn't want HIV/Syph testing.    Exposure to STD      Home Medications Prior to Admission medications   Medication Sig Start Date End Date Taking? Authorizing Provider  albuterol (PROVENTIL HFA;VENTOLIN HFA) 108 (90 BASE) MCG/ACT inhaler Inhale into the lungs every 6 (six) hours as needed for wheezing or shortness of breath.    [provider]  cephALEXin (KEFLEX) 500 MG capsule Take 1 capsule (500 mg total) by mouth 3 (three) times daily. 07/19/21   Zadie Rhine, MD      Allergies    Metronidazole    Review of Systems   Review of Systems  Physical Exam Updated Vital Signs BP 122/81 (BP Location: Right Arm)    Pulse 74    Temp 98.5 F (36.9 C) (Oral)    Resp 20    Ht 5' (1.524 m)    Wt 53.5 kg    LMP 09/08/2021 (Approximate)    SpO2 99%    BMI 23.05 kg/m  Physical Exam Vitals and nursing note reviewed.  Constitutional:      Appearance: She is well-developed.  HENT:     Head: Normocephalic and atraumatic.     Mouth/Throat:     Mouth: Mucous membranes are moist.  Eyes:     Pupils: Pupils are equal, round, and reactive to light.  Cardiovascular:     Rate and Rhythm: Normal rate and regular rhythm.  Pulmonary:     Effort: No respiratory distress.     Breath sounds: No stridor.  Abdominal:     General: Abdomen is flat. There is no distension.     Tenderness: There is no abdominal tenderness.  Musculoskeletal:        General: No swelling or tenderness. Normal range of motion.     Cervical back: Normal range of motion.  Skin:    General: Skin is warm and dry.  Neurological:     General: No focal deficit present.     Mental  Status: She is alert.    ED Results / Procedures / Treatments   Labs (all labs ordered are listed, but only abnormal results are displayed) Labs Reviewed  WET PREP, GENITAL - Abnormal; Notable for the following components:      Result Value   Clue Cells Wet Prep HPF POC PRESENT (*)    All other components within normal limits  GC/CHLAMYDIA PROBE AMP ([place]) NOT AT [facility]    EKG None  Radiology No results found.  Procedures Procedures    Medications Ordered in ED Medications - No data to display  ED Course/ Medical Decision Making/ A&P                           Medical Decision Making Amount and/or Complexity of Data Reviewed Labs: ordered.   Shared decision making and secondary to no symptoms, will self swab, no indication for pelvic or imaging.   Swab with clue cells only. Doubt symptomatic BV without symptoms....will await GC/Chlam testing prior to treatment. Once again, refused HIV/Syphillis testing.    Final Clinical Impression(s) / ED Diagnoses Final  diagnoses:  Possible exposure to STD    Rx / DC Orders ED Discharge Orders     None         Dellar Traber, Barbara Cower, MD 09/21/21 [phone]

## 2021-09-21 NOTE — ED Triage Notes (Signed)
Pt requesting STD testing due to possible Exposure. No Sx at this time.

## 2021-09-22 LAB — GC/CHLAMYDIA PROBE AMP (~~LOC~~) NOT AT ARMC
Chlamydia: NEGATIVE
Comment: NEGATIVE
Comment: NORMAL
Neisseria Gonorrhea: NEGATIVE

## 2022-07-17 ENCOUNTER — Emergency Department (HOSPITAL_BASED_OUTPATIENT_CLINIC_OR_DEPARTMENT_OTHER): Payer: Medicaid Other

## 2022-07-17 ENCOUNTER — Encounter (HOSPITAL_BASED_OUTPATIENT_CLINIC_OR_DEPARTMENT_OTHER): Payer: Self-pay | Admitting: Emergency Medicine

## 2022-07-17 ENCOUNTER — Emergency Department (HOSPITAL_BASED_OUTPATIENT_CLINIC_OR_DEPARTMENT_OTHER)
Admission: EM | Admit: 2022-07-17 | Discharge: 2022-07-17 | Disposition: A | Discharge status: Home / Self Care | Payer: Medicaid Other | Attending: Emergency Medicine | Admitting: Emergency Medicine

## 2022-07-17 DIAGNOSIS — Y990 Civilian activity done for income or pay: Secondary | ICD-10-CM | POA: Insufficient documentation

## 2022-07-17 DIAGNOSIS — S46011A Strain of muscle(s) and tendon(s) of the rotator cuff of right shoulder, initial encounter: Secondary | ICD-10-CM | POA: Insufficient documentation

## 2022-07-17 MED ORDER — METHOCARBAMOL 500 MG PO TABS: 500.0000 mg | ORAL_TABLET | Freq: Two times a day (BID) | ORAL | 0 refills | Status: DC

## 2022-07-17 NOTE — ED Triage Notes (Signed)
Pt reports dislocating her right shoulder 2 nights ago. Here today for evaluation bc pain is not improving.

## 2022-07-17 NOTE — Discharge Instructions (Signed)
Please use Tylenol or ibuprofen for pain.  You may use 600 mg ibuprofen every 6 hours or 1000 mg of Tylenol every 6 hours.  You may choose to alternate between the 2.  This would be most effective.  Not to exceed 4 g of Tylenol within 24 hours.  Not to exceed 3200 mg ibuprofen 24 hours.  You can use the muscle relaxant I am prescribing in addition to the above to help with any breakthrough pain.  You can take it up to twice daily.  It is safe to take at night, but I would be cautious taking it during the day as it can cause some drowsiness.  Make sure that you are feeling awake and alert before you get behind the wheel of a car or operate a motor vehicle.  It is not a narcotic pain medication so you are able to take it if it is not making you drowsy and still pilot a vehicle or machinery safely.  In addition to the above I would keep the shoulder immobilized in the sling for at least the next 2 days, slowly returning to normal activity as tolerated.  I would ice for 15 minutes at a time before taking a 20 to 30-minute break on and off is much as you are able to tolerate.  Please follow-up with orthopedics for further evaluation and management.
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
## 2022-07-17 NOTE — ED Notes (Signed)
Discharge instructions reviewed with patient. Patient verbalizes understanding, no further questions at this time. Medications/prescriptions and follow up information provided. No acute distress noted at time of departure.  

## 2022-07-25 ENCOUNTER — Ambulatory Visit (INDEPENDENT_AMBULATORY_CARE_PROVIDER_SITE_OTHER): Payer: Self-pay | Admitting: Family Medicine

## 2022-07-25 ENCOUNTER — Encounter: Payer: Self-pay | Admitting: Family Medicine

## 2022-07-25 VITALS — BP 118/76 | Ht 60.0 in | Wt 115.0 lb

## 2022-07-25 DIAGNOSIS — M778 Other enthesopathies, not elsewhere classified: Secondary | ICD-10-CM | POA: Insufficient documentation

## 2022-07-25 NOTE — Patient Instructions (Signed)
Nice to meet you Please use heat if needed  Please use ibuprofen if needed  Please try the exercises   Please send me a message in MyChart with any questions or updates.  Please see me back in 4 weeks or as needed if better.   --Dr. Jordan Likes

## 2022-07-25 NOTE — Progress Notes (Signed)
  Alexis Peters - 31 y.o. female MRN [id number]  Date of birth: [birth year]  SUBJECTIVE:  Including CC & ROS.  No chief complaint on file.   Alexis Peters is a 31 y.o. female that is presenting with acute right shoulder pain.  Has a history of dislocation of the same shoulder.  She was unable to move her shoulder when she initially injured it about a week ago.  Review of the emergency department note from 11/13 shows she was provided robaxin.  Review of the right shoulder x-ray from 11/13 shows no acute changes.  Review of Systems See HPI   HISTORY: Past Medical, Surgical, Social, and Family History Reviewed & Updated per EMR.   Pertinent Historical Findings include:  Past Medical History:  Diagnosis Date   Asthma     Past Surgical History:  Procedure Laterality Date   INDUCED ABORTION     WISDOM TOOTH EXTRACTION       PHYSICAL EXAM:  VS: BP 118/76   Ht 5' (1.524 m)   Wt 115 lb (52.2 kg)   LMP 07/17/2022 (Exact Date)   BMI 22.46 kg/m  Physical Exam Gen: NAD, alert, cooperative with exam, well-appearing MSK: Neurovascularly intact       ASSESSMENT & PLAN:   Capsulitis of right shoulder Acutely occurring.  Symptoms more consistent with capsule irritation at this time.  Has good range of motion and strength -Counseled on home exercise therapy and supportive care. -Could consider physical therapy or shockwave therapy

## 2022-07-25 NOTE — Assessment & Plan Note (Signed)
Acutely occurring.  Symptoms more consistent with capsule irritation at this time.  Has good range of motion and strength -Counseled on home exercise therapy and supportive care. -Could consider physical therapy or shockwave therapy

## 2022-09-02 ENCOUNTER — Emergency Department (HOSPITAL_BASED_OUTPATIENT_CLINIC_OR_DEPARTMENT_OTHER)
Admission: EM | Admit: 2022-09-02 | Discharge: 2022-09-02 | Disposition: A | Discharge status: Home / Self Care | Payer: Medicaid Other | Attending: Emergency Medicine | Admitting: Emergency Medicine

## 2022-09-02 ENCOUNTER — Other Ambulatory Visit: Payer: Self-pay

## 2022-09-02 ENCOUNTER — Encounter (HOSPITAL_BASED_OUTPATIENT_CLINIC_OR_DEPARTMENT_OTHER): Payer: Self-pay | Admitting: Emergency Medicine

## 2022-09-02 DIAGNOSIS — J45909 Unspecified asthma, uncomplicated: Secondary | ICD-10-CM | POA: Insufficient documentation

## 2022-09-02 DIAGNOSIS — Z1152 Encounter for screening for COVID-19: Secondary | ICD-10-CM | POA: Insufficient documentation

## 2022-09-02 DIAGNOSIS — J069 Acute upper respiratory infection, unspecified: Secondary | ICD-10-CM | POA: Insufficient documentation

## 2022-09-02 LAB — RESP PANEL BY RT-PCR (RSV, FLU A&B, COVID)  RVPGX2
Influenza A by PCR: NEGATIVE
Influenza B by PCR: NEGATIVE
Resp Syncytial Virus by PCR: NEGATIVE
SARS Coronavirus 2 by RT PCR: NEGATIVE

## 2022-09-02 MED ORDER — ALBUTEROL SULFATE HFA 108 (90 BASE) MCG/ACT IN AERS: 1.0000 | INHALATION_SPRAY | Freq: Four times a day (QID) | RESPIRATORY_TRACT | 0 refills | Status: AC | PRN

## 2022-09-02 MED ORDER — PROMETHAZINE-DM 6.25-15 MG/5ML PO SYRP
5.0000 mL | ORAL_SOLUTION | Freq: Four times a day (QID) | ORAL | 0 refills | Status: DC | PRN
Start: 2022-09-02 — End: 2022-09-16

## 2022-09-02 NOTE — ED Triage Notes (Signed)
Pt c/o cough, chills since yesterday

## 2022-09-02 NOTE — Discharge Instructions (Addendum)
You were seen in the emergency department today for cough.  You likely have a viral upper respiratory infection.  Please continue to take Tylenol and ibuprofen for fever and bodyaches.  I am also prescribing you some cough syrup that may be helpful.  Additionally I prescribed you a new albuterol inhaler for you to use every 6 hours as needed.  Please return to the emergency department for worsening shortness of breath or difficulty breathing.

## 2022-09-02 NOTE — ED Provider Notes (Cosign Needed)
[facility] EMERGENCY DEPARTMENT Provider Note   CSN: [id number] Arrival date & time: 09/02/22  1751     History  Chief Complaint  Patient presents with   Cough    Alexis Peters is a 31 y.o. female.  With past medical history of asthma who presents to the emergency department with cough.  Patient states that yesterday she woke up generally feeling unwell.  She describes having sore throat and rhinorrhea.  She states that this morning she woke up with dry cough, chills.  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  She states that at work today she felt mildly short of breath.  She does not have an inhaler and states that it is expired.  She has been taking Robitussin with mild relief of symptoms.  She has had decreased p.o. intake today but is still tolerating liquids.  She denies having any sick contacts.  She denies chest pain or palpitations.  She denies recent long flights or drives, estrogen birth control, recent surgery, history of malignancy, history of blood clot or family history.    Cough Associated symptoms: chills, rhinorrhea, shortness of breath and sore throat        Home Medications Prior to Admission medications   Medication Sig Start Date End Date Taking? Authorizing Provider  promethazine-dextromethorphan (PROMETHAZINE-DM) 6.25-15 MG/5ML syrup Take 5 mLs by mouth 4 (four) times daily as needed for cough. 09/02/22  Yes Cristopher Peru, PA-C  albuterol (VENTOLIN HFA) 108 (90 Base) MCG/ACT inhaler Inhale 1-2 puffs into the lungs every 6 (six) hours as needed for wheezing or shortness of breath. 09/02/22   Cristopher Peru, PA-C  cephALEXin (KEFLEX) 500 MG capsule Take 1 capsule (500 mg total) by mouth 3 (three) times daily. 07/19/21   Zadie Rhine, MD  methocarbamol (ROBAXIN) 500 MG tablet Take 1 tablet (500 mg total) by mouth 2 (two) times daily. 07/17/22   Prosperi, Christian H, PA-C      Allergies    Metronidazole     Review of Systems   Review of Systems  Constitutional:  Positive for appetite change and chills.  HENT:  Positive for congestion, rhinorrhea and sore throat.   Respiratory:  Positive for cough and shortness of breath.   All other systems reviewed and are negative.   Physical Exam Updated Vital Signs BP 118/87 (BP Location: Right Arm)   Pulse (!) 102   Temp 98.7 F (37.1 C) (Oral)   Resp 14   Ht 5' (1.524 m)   Wt 52.2 kg   LMP 08/18/2022   SpO2 100%   BMI 22.46 kg/m  Physical Exam Vitals and nursing note reviewed.  Constitutional:      General: She is not in acute distress.    Appearance: Normal appearance. She is ill-appearing. She is not toxic-appearing.  HENT:     Head: Normocephalic and atraumatic.     Nose: Congestion present.     Mouth/Throat:     Mouth: Mucous membranes are moist.     Pharynx: Oropharynx is clear. Posterior oropharyngeal erythema present.  Eyes:     General: No scleral icterus.    Extraocular Movements: Extraocular movements intact.     Pupils: Pupils are equal, round, and reactive to light.  Cardiovascular:     Rate and Rhythm: Normal rate and regular rhythm.     Pulses: Normal pulses.     Heart sounds: No murmur heard. Pulmonary:     Effort:  Pulmonary effort is normal. No respiratory distress.     Breath sounds: Normal breath sounds. No wheezing, rhonchi or rales.  Musculoskeletal:     Cervical back: Neck supple.  Skin:    General: Skin is warm and dry.     Capillary Refill: Capillary refill takes less than 2 seconds.     Findings: No rash.  Neurological:     General: No focal deficit present.     Mental Status: She is alert and oriented to person, place, and time.  Psychiatric:        Mood and Affect: Mood normal.        Behavior: Behavior normal.        Thought Content: Thought content normal.        Judgment: Judgment normal.     ED Results / Procedures / Treatments   Labs (all labs ordered are listed, but only abnormal  results are displayed) Labs Reviewed  RESP PANEL BY RT-PCR (RSV, FLU A&B, COVID)  RVPGX2    EKG None  Radiology No results found.  Procedures Procedures   Medications Ordered in ED Medications - No data to display  ED Course/ Medical Decision Making/ A&P Clinical Course as of 09/02/22 2037  Sat Sep 02, 2022  1950 I evaluated at bedside.  This is a 31 year old female with fever cough congestion x 1 day.  Tachycardic to 110 I recommended further evaluation including laboratory evaluation for PE, anemia, dehydration.  Patient stated she did not want any of that that she really just wanted to get a work note so she did not spread her symptoms to work.  She did endorse an episode ofPain shortness of breath earlier in the day.  Again I recommended further objective evaluation but patient declined.  She stated she would return if her symptoms recurred. [CC]    Clinical Course User Index [CC] Glyn Ade, MD                           Medical Decision Making Risk Prescription drug management.  Initial Impression and Ddx 31 year old female who presents to the emergency department with cough and viral symptoms.  She is overall ill-appearing, nonseptic, nontoxic in appearance and hemodynamically stable.  She had COVID, flu, RSV testing prior to my evaluation which was negative.  Her lungs are clear bilaterally without wheezing, rhonchi, rales.  She was tachycardic to 110 on initial vitals, but while in the room talking with her heart rate goes down into the 90s. Patient PMH that increases complexity of ED encounter: Asthma  Interpretation of Diagnostics I independent reviewed and interpreted the labs as followed: COVID, flu, RSV negative  - I independently visualized the following imaging with scope of interpretation limited to determining acute life threatening conditions related to emergency care: Not indicated  Patient Reassessment and Ultimate Disposition/Management Her  symptoms are most consistent with a viral upper respiratory infection.  Given that her symptoms are less than 24 hours, may not have reliable respiratory panel at this time.  I did discuss this with her.  Do not feel that she needs a chest x-ray at this time as her lungs are clear bilaterally and she is oxygenating well on room air. She did complain of being short of breath at work today.  I talked with her at bedside as well as Dr. Doran Durand about having further workup of her tachycardia.  I do think that this is likely related to her viral  illness.  I considered PE.  Cannot PERC out due to her heart rate, however she is very low risk based on my assessment.  Additionally she has no history of anemia.  We discussed doing lab work, IV fluids and reassessment but she is adamant about going home.  We gave her return precautions for any worsening symptoms and she verbalized understanding. I will prescribe her some Promethazine DM as well as albuterol inhaler that has been expired for her.  I discussed this case with my attending physician who cosigned this note including patient's presenting symptoms, physical exam, and planned diagnostics and interventions. Attending physician stated agreement with plan or made changes to plan which were implemented.   Attending physician assessed patient at bedside.   The patient has been appropriately medically screened and/or stabilized in the ED. I have low suspicion for any other emergent medical condition which would require further screening, evaluation or treatment in the ED or require inpatient management. At time of discharge the patient is hemodynamically stable and in no acute distress. I have discussed work-up results and diagnosis with patient and answered all questions. Patient is agreeable with discharge plan. We discussed strict return precautions for returning to the emergency department and they verbalized understanding.      Patient management required  discussion with the following services or consulting groups:  None  Complexity of Problems Addressed Acute uncomplicated illness or injury with no diagnostics  Additional Data Reviewed and Analyzed Further history obtained from: Prior ED visit notes and Care Everywhere  Patient Encounter Risk Assessment Prescriptions and SDOH impact on management  Final Clinical Impression(s) / ED Diagnoses Final diagnoses:  Viral URI with cough    Rx / DC Orders ED Discharge Orders          Ordered    promethazine-dextromethorphan (PROMETHAZINE-DM) 6.25-15 MG/5ML syrup  4 times daily PRN        09/02/22 1955    albuterol (VENTOLIN HFA) 108 (90 Base) MCG/ACT inhaler  Every 6 hours PRN        09/02/22 1955              Cristopher Peru, PA-C 09/02/22 2040

## 2022-09-07 ENCOUNTER — Emergency Department (HOSPITAL_BASED_OUTPATIENT_CLINIC_OR_DEPARTMENT_OTHER): Payer: Self-pay

## 2022-09-07 ENCOUNTER — Other Ambulatory Visit: Payer: Self-pay

## 2022-09-07 ENCOUNTER — Encounter (HOSPITAL_BASED_OUTPATIENT_CLINIC_OR_DEPARTMENT_OTHER): Payer: Self-pay | Admitting: Urology

## 2022-09-07 ENCOUNTER — Emergency Department (HOSPITAL_BASED_OUTPATIENT_CLINIC_OR_DEPARTMENT_OTHER)
Admission: EM | Admit: 2022-09-07 | Discharge: 2022-09-07 | Disposition: A | Discharge status: Home / Self Care | Payer: Self-pay | Attending: Emergency Medicine | Admitting: Emergency Medicine

## 2022-09-07 DIAGNOSIS — Z20822 Contact with and (suspected) exposure to covid-19: Secondary | ICD-10-CM | POA: Insufficient documentation

## 2022-09-07 DIAGNOSIS — J45909 Unspecified asthma, uncomplicated: Secondary | ICD-10-CM | POA: Insufficient documentation

## 2022-09-07 DIAGNOSIS — J069 Acute upper respiratory infection, unspecified: Secondary | ICD-10-CM | POA: Insufficient documentation

## 2022-09-07 LAB — RESP PANEL BY RT-PCR (RSV, FLU A&B, COVID)  RVPGX2
Influenza A by PCR: NEGATIVE
Influenza B by PCR: NEGATIVE
Resp Syncytial Virus by PCR: NEGATIVE
SARS Coronavirus 2 by RT PCR: NEGATIVE

## 2022-09-07 MED ORDER — DEXAMETHASONE 4 MG PO TABS
10.0000 mg | ORAL_TABLET | Freq: Once | ORAL | Status: AC
  Administered 2022-09-07: 10 mg via ORAL
  Filled 2022-09-07: qty 3

## 2022-09-07 MED ORDER — ALBUTEROL SULFATE HFA 108 (90 BASE) MCG/ACT IN AERS
2.0000 | INHALATION_SPRAY | Freq: Once | RESPIRATORY_TRACT | Status: AC
  Administered 2022-09-07: 2 via RESPIRATORY_TRACT
  Filled 2022-09-07: qty 6.7

## 2022-09-07 NOTE — ED Provider Notes (Signed)
[facility] EMERGENCY DEPARTMENT Provider Note   CSN: [id number] Arrival date & time: 09/07/22  1449     History  Chief Complaint  Patient presents with   Cough    Alexis Peters is a 32 y.o. female.  Patient here with cough.  Symptoms for the last few days.  Feels like it is getting worse.  Nothing makes it worse or better.  History of asthma in the past.  Denies any chest pain shortness of breath or sputum production or fever.  Chills have improved.  Needs a work note.  The history is provided by the patient.       Home Medications Prior to Admission medications   Medication Sig Start Date End Date Taking? Authorizing Provider  albuterol (VENTOLIN HFA) 108 (90 Base) MCG/ACT inhaler Inhale 1-2 puffs into the lungs every 6 (six) hours as needed for wheezing or shortness of breath. 09/02/22   Mickie Hillier, PA-C  cephALEXin (KEFLEX) 500 MG capsule Take 1 capsule (500 mg total) by mouth 3 (three) times daily. 07/19/21   Ripley Fraise, MD  methocarbamol (ROBAXIN) 500 MG tablet Take 1 tablet (500 mg total) by mouth 2 (two) times daily. 07/17/22   Prosperi, Christian H, PA-C  promethazine-dextromethorphan (PROMETHAZINE-DM) 6.25-15 MG/5ML syrup Take 5 mLs by mouth 4 (four) times daily as needed for cough. 09/02/22   Mickie Hillier, PA-C      Allergies    Metronidazole    Review of Systems   Review of Systems  Physical Exam Updated Vital Signs BP (!) 127/100   Pulse 98   Temp 98.9 F (37.2 C)   Resp 18   Ht 5' (1.524 m)   Wt 52.2 kg   LMP 08/18/2022   SpO2 100%   BMI 22.48 kg/m  Physical Exam Vitals and nursing note reviewed.  Constitutional:      General: She is not in acute distress.    Appearance: She is well-developed.  HENT:     Head: Normocephalic and atraumatic.     Nose: Nose normal.     Mouth/Throat:     Mouth: Mucous membranes are moist.  Eyes:     Extraocular Movements: Extraocular movements intact.     Conjunctiva/sclera: Conjunctivae  normal.     Pupils: Pupils are equal, round, and reactive to light.  Cardiovascular:     Rate and Rhythm: Normal rate and regular rhythm.     Pulses: Normal pulses.     Heart sounds: Normal heart sounds. No murmur heard. Pulmonary:     Effort: Pulmonary effort is normal. No respiratory distress.     Breath sounds: Normal breath sounds.  Abdominal:     Palpations: Abdomen is soft.     Tenderness: There is no abdominal tenderness.  Musculoskeletal:        General: No swelling.     Cervical back: Neck supple.  Skin:    General: Skin is warm and dry.     Capillary Refill: Capillary refill takes less than 2 seconds.  Neurological:     Mental Status: She is alert.  Psychiatric:        Mood and Affect: Mood normal.     ED Results / Procedures / Treatments   Labs (all labs ordered are listed, but only abnormal results are displayed) Labs Reviewed  RESP PANEL BY RT-PCR (RSV, FLU A&B, COVID)  RVPGX2    EKG EKG Interpretation  Date/Time:  Thursday September 07 2022 15:13:06 EST Ventricular Rate:  89 PR  Interval:  111 QRS Duration: 80 QT Interval:  365 QTC Calculation: 445 R Axis:   55 Text Interpretation: Sinus rhythm Borderline short PR interval Confirmed by Lennice Sites ([id number]) on 09/07/2022 3:16:21 PM  Radiology DG Chest 2 View  Result Date: 09/07/2022 CLINICAL DATA:  Cough. EXAM: CHEST - 2 VIEW COMPARISON:  Jan 10, 2020. FINDINGS: The heart size and mediastinal contours are within normal limits. Both lungs are clear. The visualized skeletal structures are unremarkable. IMPRESSION: No active cardiopulmonary disease. Electronically Signed   By: Marijo Conception M.D.   On: 09/07/2022 15:23    Procedures Procedures    Medications Ordered in ED Medications  dexamethasone (DECADRON) tablet 10 mg (has no administration in time range)  albuterol (VENTOLIN HFA) 108 (90 Base) MCG/ACT inhaler 2 puff (has no administration in time range)    ED Course/ Medical Decision Making/ A&P                            Medical Decision Making Amount and/or Complexity of Data Reviewed Radiology: ordered.  Risk Prescription drug management.   [name] is here with cough.  History of asthma.  Normal vitals.  No fever.  Very well-appearing.  COVID and flu test negative per my review and interpretation of labs.  Chest x-ray per my review and interpretation negative for pneumonia or pneumothorax.  Suspect resolving viral process/bronchitis.  Will give albuterol and Decadron for symptomatic support.  Discharged in good condition.  Understands return precautions.  This chart was dictated using voice recognition software.  Despite best efforts to proofread,  errors can occur which can change the documentation meaning.         Final Clinical Impression(s) / ED Diagnoses Final diagnoses:  Viral URI with cough    Rx / DC Orders ED Discharge Orders     None         Lennice Sites, DO 09/07/22 1747

## 2022-09-07 NOTE — ED Triage Notes (Signed)
Pt states continued cough and chills since 12/30.  States symptoms get worse at night States feels like heart is racing, HR 88 in triage

## 2022-09-07 NOTE — ED Notes (Signed)
Reviewed discharge instructions and recommendations with pt. Pt states understanding. Work note provided

## 2022-09-16 ENCOUNTER — Telehealth: Payer: Self-pay | Admitting: Physician Assistant

## 2022-09-16 DIAGNOSIS — R3989 Other symptoms and signs involving the genitourinary system: Secondary | ICD-10-CM

## 2022-09-16 MED ORDER — CEPHALEXIN 500 MG PO CAPS: 500.0000 mg | ORAL_CAPSULE | Freq: Two times a day (BID) | ORAL | 0 refills | Status: AC
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
## 2022-09-16 NOTE — Patient Instructions (Signed)
Jerilee Hoh, thank you for joining Leeanne Rio, PA-C for today's virtual visit.  While this provider is not your primary care provider (PCP), if your PCP is located in our provider database this encounter information will be shared with them immediately following your visit.   [facility] account gives you access to today's visit and all your visits, tests, and labs performed at [facility] " click here if you don't have a [facility] account or go to [email or web address]  Consent: (Patient) Yzabelle Calles provided verbal consent for this virtual visit at the beginning of the encounter.  Current Medications:  Current Outpatient Medications:    albuterol (VENTOLIN HFA) 108 (90 Base) MCG/ACT inhaler, Inhale 1-2 puffs into the lungs every 6 (six) hours as needed for wheezing or shortness of breath., Disp: 18 g, Rfl: 0   cephALEXin (KEFLEX) 500 MG capsule, Take 1 capsule (500 mg total) by mouth 3 (three) times daily., Disp: 21 capsule, Rfl: 0   methocarbamol (ROBAXIN) 500 MG tablet, Take 1 tablet (500 mg total) by mouth 2 (two) times daily., Disp: 20 tablet, Rfl: 0   promethazine-dextromethorphan (PROMETHAZINE-DM) 6.25-15 MG/5ML syrup, Take 5 mLs by mouth 4 (four) times daily as needed for cough., Disp: 118 mL, Rfl: 0   Medications ordered in this encounter:  No orders of the defined types were placed in this encounter.    *If you need refills on other medications prior to your next appointment, please contact your pharmacy*  Follow-Up: Call back or seek an in-person evaluation if the symptoms worsen or if the condition fails to improve as anticipated.  Trainer [phone]  Other Instructions Your symptoms are consistent with a bladder infection, also called acute cystitis. Please take your antibiotic (Keflex) as directed until all pills are gone.  Stay very well hydrated.  Consider a daily probiotic (Align, Culturelle, or  Activia) to help prevent stomach upset caused by the antibiotic.  Taking a probiotic daily may also help prevent recurrent UTIs.  Also consider taking AZO (Phenazopyridine) tablets to help decrease pain with urination.     Urinary Tract Infection A urinary tract infection (UTI) can occur any place along the urinary tract. The tract includes the kidneys, ureters, bladder, and urethra. A type of germ called bacteria often causes a UTI. UTIs are often helped with antibiotic medicine.  HOME CARE  If given, take antibiotics as told by your doctor. Finish them even if you start to feel better. Drink enough fluids to keep your pee (urine) clear or pale yellow. Avoid tea, drinks with caffeine, and bubbly (carbonated) drinks. Pee often. Avoid holding your pee in for a long time. Pee before and after having sex (intercourse). Wipe from front to back after you poop (bowel movement) if you are a woman. Use each tissue only once. GET HELP RIGHT AWAY IF:  You have back pain. You have lower belly (abdominal) pain. You have chills. You feel sick to your stomach (nauseous). You throw up (vomit). Your burning or discomfort with peeing does not go away. You have a fever. Your symptoms are not better in 3 days. MAKE SURE YOU:  Understand these instructions. Will watch your condition. Will get help right away if you are not doing well or get worse. Document Released: 02/07/2008 Document Revised: 05/15/2012 Document Reviewed: 03/21/2012 [facility] Patient Information 2015 [place], [place]. This information is not intended to replace advice given to you by your health care provider. Make sure you discuss any  questions you have with your health care provider.    If you have been instructed to have an in-person evaluation today at a local Urgent Care facility, please use the link below. It will take you to a list of all of our available [facility], including address, phone number and hours of  operation. Please do not delay care.  [facility]  If you or a family member do not have a primary care provider, use the link below to schedule a visit and establish care. When you choose a [facility] primary care physician or advanced practice provider, you gain a long-term partner in health. Find a Primary Care Provider  Learn more about [facility]'s in-office and virtual care options: [facility]

## 2022-12-18 ENCOUNTER — Encounter: Payer: Self-pay | Admitting: *Deleted

## 2023-01-31 ENCOUNTER — Emergency Department (HOSPITAL_BASED_OUTPATIENT_CLINIC_OR_DEPARTMENT_OTHER): Payer: No Typology Code available for payment source

## 2023-01-31 ENCOUNTER — Other Ambulatory Visit: Payer: Self-pay

## 2023-01-31 ENCOUNTER — Emergency Department (HOSPITAL_BASED_OUTPATIENT_CLINIC_OR_DEPARTMENT_OTHER)
Admission: EM | Admit: 2023-01-31 | Discharge: 2023-01-31 | Disposition: A | Discharge status: Home / Self Care | Payer: No Typology Code available for payment source | Attending: Emergency Medicine | Admitting: Emergency Medicine

## 2023-01-31 ENCOUNTER — Encounter (HOSPITAL_BASED_OUTPATIENT_CLINIC_OR_DEPARTMENT_OTHER): Payer: Self-pay | Admitting: Emergency Medicine

## 2023-01-31 DIAGNOSIS — Y92410 Unspecified street and highway as the place of occurrence of the external cause: Secondary | ICD-10-CM | POA: Diagnosis not present

## 2023-01-31 DIAGNOSIS — M546 Pain in thoracic spine: Secondary | ICD-10-CM | POA: Insufficient documentation

## 2023-01-31 DIAGNOSIS — J45909 Unspecified asthma, uncomplicated: Secondary | ICD-10-CM | POA: Insufficient documentation

## 2023-01-31 MED ORDER — IBUPROFEN 400 MG PO TABS
600.0000 mg | ORAL_TABLET | Freq: Once | ORAL | Status: AC
  Administered 2023-01-31: 600 mg via ORAL
  Filled 2023-01-31: qty 1

## 2023-01-31 MED ORDER — CYCLOBENZAPRINE HCL 10 MG PO TABS: 10.0000 mg | ORAL_TABLET | Freq: Two times a day (BID) | ORAL | 0 refills | Status: AC | PRN

## 2023-01-31 MED ORDER — IBUPROFEN 600 MG PO TABS: 600.0000 mg | ORAL_TABLET | Freq: Four times a day (QID) | ORAL | 0 refills | Status: AC | PRN
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
## 2023-07-30 ENCOUNTER — Other Ambulatory Visit: Payer: Self-pay

## 2023-07-30 ENCOUNTER — Encounter (HOSPITAL_BASED_OUTPATIENT_CLINIC_OR_DEPARTMENT_OTHER): Payer: Self-pay

## 2023-07-30 ENCOUNTER — Emergency Department (HOSPITAL_BASED_OUTPATIENT_CLINIC_OR_DEPARTMENT_OTHER)
Admission: EM | Admit: 2023-07-30 | Discharge: 2023-07-30 | Disposition: A | Discharge status: Home / Self Care | Payer: Managed Care, Other (non HMO) | Attending: Emergency Medicine | Admitting: Emergency Medicine

## 2023-07-30 ENCOUNTER — Emergency Department (HOSPITAL_BASED_OUTPATIENT_CLINIC_OR_DEPARTMENT_OTHER): Payer: Self-pay

## 2023-07-30 DIAGNOSIS — J45909 Unspecified asthma, uncomplicated: Secondary | ICD-10-CM | POA: Insufficient documentation

## 2023-07-30 DIAGNOSIS — R059 Cough, unspecified: Secondary | ICD-10-CM | POA: Diagnosis present

## 2023-07-30 DIAGNOSIS — J069 Acute upper respiratory infection, unspecified: Secondary | ICD-10-CM | POA: Insufficient documentation

## 2023-07-30 DIAGNOSIS — Z1152 Encounter for screening for COVID-19: Secondary | ICD-10-CM | POA: Diagnosis not present

## 2023-07-30 LAB — RESP PANEL BY RT-PCR (RSV, FLU A&B, COVID)  RVPGX2
Influenza A by PCR: NEGATIVE
Influenza B by PCR: NEGATIVE
Resp Syncytial Virus by PCR: NEGATIVE
SARS Coronavirus 2 by RT PCR: NEGATIVE

## 2023-07-30 MED ORDER — GUAIFENESIN-CODEINE 100-10 MG/5ML PO SOLN: 5.0000 mL | Freq: Three times a day (TID) | ORAL | 0 refills | Status: AC | PRN

## 2023-07-30 MED ORDER — IPRATROPIUM-ALBUTEROL 0.5-2.5 (3) MG/3ML IN SOLN
3.0000 mL | Freq: Once | RESPIRATORY_TRACT | Status: AC
  Administered 2023-07-30: 3 mL via RESPIRATORY_TRACT
  Filled 2023-07-30: qty 3

## 2023-07-30 MED ORDER — OXYMETAZOLINE HCL 0.05 % NA SOLN: 1.0000 | Freq: Two times a day (BID) | NASAL | 0 refills | Status: AC

## 2023-07-30 MED ORDER — CETIRIZINE HCL 10 MG PO TABS: 10.0000 mg | ORAL_TABLET | Freq: Every day | ORAL | 0 refills | Status: AC

## 2023-07-30 NOTE — Discharge Instructions (Addendum)
It was a pleasure caring for you today in the emergency department.  Drink plenty fluids, get plenty of rest over the next few days.  Please follow-up with your PCP for recheck  Please return to the emergency department for any worsening or worrisome symptoms.

## 2023-07-30 NOTE — ED Provider Notes (Signed)
[place] [facility] Provider Note  CSN: [id number] Arrival date & time: 07/30/23 0820  Chief Complaint(s) Nasal Congestion  HPI Alexis Peters is a 32 y.o. female with past medical history as below, significant for asthma who presents to the ED with complaint of URI  She has been having cough for approximately 5 days, has somewhat worsened over the past 3 days.  Having some chills at nighttime but no objective fevers.  No vomiting.  No significant dyspnea.  She has history of asthma and takes albuterol as needed.  Cough is intermittently productive with green or clear sputum.  She is taking Robitussin at home which has helped somewhat with the cough.  No abdominal pain.  No change in bowel or bladder function, no recent travel or sick contacts.  Past Medical History Past Medical History:  Diagnosis Date   Asthma    Patient Active Problem List   Diagnosis Date Noted   Capsulitis of right shoulder 07/25/2022   Home Medication(s) Prior to Admission medications   Medication Sig Start Date End Date Taking? Authorizing Provider  albuterol (VENTOLIN HFA) 108 (90 Base) MCG/ACT inhaler Inhale 1-2 puffs into the lungs every 6 (six) hours as needed for wheezing or shortness of breath. 09/02/22  Yes Cristopher Peru, PA-C  cetirizine (ZYRTEC ALLERGY) 10 MG tablet Take 1 tablet (10 mg total) by mouth daily for 14 days. 07/30/23 08/13/23 Yes Tanda Rockers A, DO  guaiFENesin-codeine 100-10 MG/5ML syrup Take 5 mLs by mouth 3 (three) times daily as needed for cough. 07/30/23  Yes Tanda Rockers A, DO  ibuprofen (ADVIL) 600 MG tablet Take 1 tablet (600 mg total) by mouth every 6 (six) hours as needed. 01/31/23  Yes Sherian Maroon A, PA  oxymetazoline (AFRIN NASAL SPRAY) 0.05 % nasal spray Place 1 spray into both nostrils 2 (two) times daily for 3 days. 07/30/23 08/02/23 Yes Sloan Leiter, DO  cyclobenzaprine (FLEXERIL) 10 MG tablet Take 1 tablet (10 mg total) by mouth 2  (two) times daily as needed for muscle spasms. 01/31/23   Peter Garter, PA                                                                                                                                    Past Surgical History Past Surgical History:  Procedure Laterality Date   INDUCED ABORTION     WISDOM TOOTH EXTRACTION     Family History History reviewed. No pertinent family history.  Social History Social History   Tobacco Use   Smoking status: Never   Smokeless tobacco: Never  Vaping Use   Vaping status: Never Used  Substance Use Topics   Alcohol use: No   Drug use: Yes    Types: Marijuana   Allergies Metronidazole  Review of Systems Review of Systems  Constitutional:  Positive for chills.  Respiratory:  Positive for cough and chest tightness.  Cardiovascular:  Negative for palpitations.  Gastrointestinal:  Negative for abdominal pain and vomiting.  Musculoskeletal:  Positive for arthralgias.  Skin:  Negative for rash.  All other systems reviewed and are negative.   Physical Exam Vital Signs  I have reviewed the triage vital signs BP 121/74   Pulse 100   Temp 98.3 F (36.8 C)   Resp 13   Ht 5' (1.524 m)   Wt 50.8 kg   LMP 06/14/2023 (Approximate)   SpO2 100%   BMI 21.87 kg/m  Physical Exam Vitals and nursing note reviewed.  Constitutional:      General: She is not in acute distress.    Appearance: Normal appearance.  HENT:     Head: Normocephalic and atraumatic.     Right Ear: External ear normal.     Left Ear: External ear normal.     Nose: Nose normal.     Mouth/Throat:     Mouth: Mucous membranes are moist.  Eyes:     General: No scleral icterus.       Right eye: No discharge.        Left eye: No discharge.  Cardiovascular:     Rate and Rhythm: Normal rate and regular rhythm.     Pulses: Normal pulses.     Heart sounds: Normal heart sounds.  Pulmonary:     Effort: Pulmonary effort is normal. No tachypnea or respiratory distress.      Breath sounds: Normal breath sounds. No stridor. No wheezing or rales.  Abdominal:     General: Abdomen is flat. There is no distension.     Palpations: Abdomen is soft.     Tenderness: There is no abdominal tenderness.  Musculoskeletal:     Cervical back: No rigidity.     Right lower leg: No edema.     Left lower leg: No edema.  Skin:    General: Skin is warm and dry.     Capillary Refill: Capillary refill takes less than 2 seconds.  Neurological:     Mental Status: She is alert.  Psychiatric:        Mood and Affect: Mood normal.        Behavior: Behavior normal. Behavior is cooperative.     ED Results and Treatments Labs (all labs ordered are listed, but only abnormal results are displayed) Labs Reviewed  RESP PANEL BY RT-PCR (RSV, FLU A&B, COVID)  RVPGX2                                                                                                                          Radiology DG Chest 2 View  Result Date: 07/30/2023 CLINICAL DATA:  32 year old female with cough and chest pain for 5 days. EXAM: CHEST - 2 VIEW COMPARISON:  Chest radiographs 01/31/2023 and earlier. FINDINGS: PA and lateral views are 0920 hours. Lung volumes and mediastinal contours remain normal. Visualized tracheal air column is within normal limits. Lung markings appear stable and both lungs  appear clear. Negative visible bowel gas and osseous structures. IMPRESSION: Negative.  No cardiopulmonary abnormality. Electronically Signed   By: Odessa Fleming M.D.   On: 07/30/2023 08:58    Pertinent labs & imaging results that were available during my care of the patient were reviewed by me and considered in my medical decision making (see MDM for details).  Medications Ordered in ED Medications  ipratropium-albuterol (DUONEB) 0.5-2.5 (3) MG/3ML nebulizer solution 3 mL (3 mLs Nebulization Given 07/30/23 0854)                                                                                                                                      Procedures Procedures  (including critical care time)  Medical Decision Making / ED Course    Medical Decision Making:    Alexis Peters is a 32 y.o. female with past medical history as below, significant for asthma who presents to the ED with complaint of URI. The complaint involves an extensive differential diagnosis and also carries with it a high risk of complications and morbidity.  Serious etiology was considered. Ddx includes but is not limited to: Pneumonia, URI, viral infection, asthma exacerbation, etc.  Complete initial physical exam performed, notably the patient was in no acute distress, no hypoxia.    Reviewed and confirmed nursing documentation for past medical history, family history, social history.  Vital signs reviewed.    Patient with cough, URI symptoms.  Will get chest x-ray, give DuoNeb.  RVP  Clinical Course as of 07/30/23 0947  Mon Jul 30, 2023  7253 Chest x-ray on my read does not demonstrate large volume pneumonia or pneumothorax.  Awaiting formal radiology interpretation [SG]    Clinical Course User Index [SG] Sloan Leiter, DO    Brief summary: 12 female here with cough, URI symptoms, congestion.  No hypoxia.  RVP negative, chest x-ray negative.  No hypoxia.  Feeling better overall.  Favor likely viral syndrome.  Supportive care encouraged at home, follow-up PCP  The patient improved significantly and was discharged in stable condition. Detailed discussions were had with the patient regarding current findings, and need for close f/u with PCP or on call doctor. The patient has been instructed to return immediately if the symptoms worsen in any way for re-evaluation. Patient verbalized understanding and is in agreement with current care plan. All questions answered prior to discharge.                  Additional history obtained: -Additional history obtained from N/A -External records from outside source obtained  and reviewed including: Chart review including previous notes, labs, imaging, consultation notes including  Prior ED visits, primary care documentation, home medications, prior labs >> Of note she did have a + positive pregnancy test 02/27/2023; denies preg   Lab Tests: -I ordered, reviewed, and interpreted labs.   The pertinent results include:   Labs Reviewed  RESP  PANEL BY RT-PCR (RSV, FLU A&B, COVID)  RVPGX2    Notable for rvp -  EKG   EKG Interpretation Date/Time:  Monday July 30 2023 08:31:11 EST Ventricular Rate:  94 PR Interval:  121 QRS Duration:  85 QT Interval:  367 QTC Calculation: 459 R Axis:   66  Text Interpretation: Sinus rhythm Right atrial enlargement Borderline repolarization abnormality no stemi Confirmed by Tanda Rockers ([id number]) on 07/30/2023 8:36:34 AM         Imaging Studies ordered: I ordered imaging studies including chest x-ray I independently visualized the following imaging with scope of interpretation limited to determining acute life threatening conditions related to emergency care; findings noted above I independently visualized and interpreted imaging. I agree with the radiologist interpretation   Medicines ordered and prescription drug management: Meds ordered this encounter  Medications   ipratropium-albuterol (DUONEB) 0.5-2.5 (3) MG/3ML nebulizer solution 3 mL   guaiFENesin-codeine 100-10 MG/5ML syrup    Sig: Take 5 mLs by mouth 3 (three) times daily as needed for cough.    Dispense:  120 mL    Refill:  0   oxymetazoline (AFRIN NASAL SPRAY) 0.05 % nasal spray    Sig: Place 1 spray into both nostrils 2 (two) times daily for 3 days.    Dispense:  15 mL    Refill:  0   cetirizine (ZYRTEC ALLERGY) 10 MG tablet    Sig: Take 1 tablet (10 mg total) by mouth daily for 14 days.    Dispense:  14 tablet    Refill:  0    -I have reviewed the patients home medicines and have made adjustments as needed   Consultations  Obtained: N/A  Cardiac Monitoring: Continuous pulse oximetry interpreted by myself, 100% on RA.    Social Determinants of Health:  Diagnosis or treatment significantly limited by social determinants of health: lives at home   Reevaluation: After the interventions noted above, I reevaluated the patient and found that they have improved  Co morbidities that complicate the patient evaluation  Past Medical History:  Diagnosis Date   Asthma       Dispostion: Disposition decision including need for hospitalization was considered, and patient discharged from emergency department.    Final Clinical Impression(s) / ED Diagnoses Final diagnoses:  URI with cough and congestion        Sloan Leiter, DO 07/30/23 [phone]

## 2023-07-30 NOTE — ED Notes (Signed)
Comments only used Albuterol inhaler once since symptoms began.

## 2023-07-30 NOTE — ED Triage Notes (Signed)
Pt reports that she has had a cough with congestion x 4 days. Report pain in chest when coughing.

## 2023-09-08 ENCOUNTER — Encounter (HOSPITAL_BASED_OUTPATIENT_CLINIC_OR_DEPARTMENT_OTHER): Payer: Self-pay

## 2023-09-08 ENCOUNTER — Other Ambulatory Visit: Payer: Self-pay

## 2023-09-08 ENCOUNTER — Emergency Department (HOSPITAL_BASED_OUTPATIENT_CLINIC_OR_DEPARTMENT_OTHER)
Admission: EM | Admit: 2023-09-08 | Discharge: 2023-09-08 | Disposition: A | Discharge status: Home / Self Care | Payer: Managed Care, Other (non HMO) | Attending: Emergency Medicine | Admitting: Emergency Medicine

## 2023-09-08 DIAGNOSIS — Z3A13 13 weeks gestation of pregnancy: Secondary | ICD-10-CM | POA: Diagnosis not present

## 2023-09-08 DIAGNOSIS — E876 Hypokalemia: Secondary | ICD-10-CM | POA: Insufficient documentation

## 2023-09-08 DIAGNOSIS — O99281 Endocrine, nutritional and metabolic diseases complicating pregnancy, first trimester: Secondary | ICD-10-CM | POA: Insufficient documentation

## 2023-09-08 DIAGNOSIS — O219 Vomiting of pregnancy, unspecified: Secondary | ICD-10-CM | POA: Insufficient documentation

## 2023-09-08 LAB — URINALYSIS, MICROSCOPIC (REFLEX)

## 2023-09-08 LAB — LIPASE, BLOOD: Lipase: 23 U/L (ref 11–51)

## 2023-09-08 LAB — COMPREHENSIVE METABOLIC PANEL
ALT: 14 U/L (ref 0–44)
AST: 15 U/L (ref 15–41)
Albumin: 4.1 g/dL (ref 3.5–5.0)
Alkaline Phosphatase: 47 U/L (ref 38–126)
Anion gap: 9 (ref 5–15)
BUN: 6 mg/dL (ref 6–20)
CO2: 20 mmol/L — ABNORMAL LOW (ref 22–32)
Calcium: 9 mg/dL (ref 8.9–10.3)
Chloride: 105 mmol/L (ref 98–111)
Creatinine, Ser: 0.6 mg/dL (ref 0.44–1.00)
GFR, Estimated: >60 mL/min (ref >60)
Glucose, Bld: 88 mg/dL (ref 70–99)
Potassium: 3.2 mmol/L — ABNORMAL LOW (ref 3.5–5.1)
Sodium: 134 mmol/L — ABNORMAL LOW (ref 135–145)
Total Bilirubin: 0.9 mg/dL (ref 0.0–1.2)
Total Protein: 7.3 g/dL (ref 6.5–8.1)

## 2023-09-08 LAB — CBC WITH DIFFERENTIAL/PLATELET
Abs Immature Granulocytes: 0.02 10*3/uL (ref 0.00–0.07)
Basophils Absolute: 0.1 10*3/uL (ref 0.0–0.1)
Basophils Relative: 1 %
Eosinophils Absolute: 0.1 10*3/uL (ref 0.0–0.5)
Eosinophils Relative: 1 %
HCT: 38.2 % (ref 36.0–46.0)
Hemoglobin: 13.4 g/dL (ref 12.0–15.0)
Immature Granulocytes: 0 %
Lymphocytes Relative: 27 %
Lymphs Abs: 2.6 10*3/uL (ref 0.7–4.0)
MCH: 30.5 pg (ref 26.0–34.0)
MCHC: 35.1 g/dL (ref 30.0–36.0)
MCV: 86.8 fL (ref 80.0–100.0)
Monocytes Absolute: 0.6 10*3/uL (ref 0.1–1.0)
Monocytes Relative: 6 %
Neutro Abs: 6.3 10*3/uL (ref 1.7–7.7)
Neutrophils Relative %: 65 %
Platelets: 386 10*3/uL (ref 150–400)
RBC: 4.4 MIL/uL (ref 3.87–5.11)
RDW: 11.4 % — ABNORMAL LOW (ref 11.5–15.5)
WBC: 9.7 10*3/uL (ref 4.0–10.5)
nRBC: 0 % (ref 0.0–0.2)

## 2023-09-08 LAB — URINALYSIS, ROUTINE W REFLEX MICROSCOPIC
Bilirubin Urine: NEGATIVE
Glucose, UA: NEGATIVE mg/dL
Hgb urine dipstick: NEGATIVE
Ketones, ur: NEGATIVE mg/dL
Leukocytes,Ua: NEGATIVE
Nitrite: NEGATIVE
Protein, ur: 30 mg/dL — AB
Specific Gravity, Urine: >=1.03 (ref 1.005–1.030)
pH: 5.5 (ref 5.0–8.0)

## 2023-09-08 MED ORDER — ONDANSETRON HCL 4 MG/2ML IJ SOLN
4.0000 mg | Freq: Once | INTRAMUSCULAR | Status: AC
  Administered 2023-09-08: 4 mg via INTRAVENOUS
  Filled 2023-09-08: qty 2

## 2023-09-08 MED ORDER — SODIUM CHLORIDE 0.9 % IV BOLUS
1000.0000 mL | Freq: Once | INTRAVENOUS | Status: AC
  Administered 2023-09-08: 1000 mL via INTRAVENOUS

## 2023-09-08 MED ORDER — ONDANSETRON 4 MG PO TBDP: 4.0000 mg | ORAL_TABLET | Freq: Three times a day (TID) | ORAL | 0 refills | Status: AC | PRN

## 2023-09-08 NOTE — ED Triage Notes (Signed)
 The patient stated she is pregnant and has had vomiting today. The meds she was given by her provider are not helping.

## 2023-09-08 NOTE — ED Provider Notes (Signed)
 [place] [facility] Provider Note   CSN: [id number] Arrival date & time: 09/08/23  2033     History  Chief Complaint  Patient presents with   Emesis During Pregnancy    Alexis Peters is a 33 y.o. female G3 P0 here for evaluation of nausea and vomiting.  LMP 06/04/2023, approximately [redacted]w[redacted]d.  Recently called to schedule OB/GYN care with [facility].  Has appointment scheduled for 09/17/2023 for initial intake.  She has been taking Unisom and B6 which typically helps with her nausea and vomiting however today she has had persistent nausea and vomiting.  Initially had some lower abdominal cramping this morning which resolved.  No vaginal bleeding, fluid leakage.  No fever, back pain, dysuria or hematuria. No prior US  to establish IUP.  HPI     Home Medications Prior to Admission medications   Medication Sig Start Date End Date Taking? Authorizing Provider  ondansetron  (ZOFRAN -ODT) 4 MG disintegrating tablet Take 1 tablet (4 mg total) by mouth every 8 (eight) hours as needed. 09/08/23  Yes Mishel Sans A, PA-C  albuterol  (VENTOLIN  HFA) 108 (90 Base) MCG/ACT inhaler Inhale 1-2 puffs into the lungs every 6 (six) hours as needed for wheezing or shortness of breath. 09/02/22   Adolph Tinnie BRAVO, PA-C  cetirizine  (ZYRTEC  ALLERGY) 10 MG tablet Take 1 tablet (10 mg total) by mouth daily for 14 days. 07/30/23 08/13/23  Elnor Jayson LABOR, DO  cyclobenzaprine  (FLEXERIL ) 10 MG tablet Take 1 tablet (10 mg total) by mouth 2 (two) times daily as needed for muscle spasms. 01/31/23   Silver Wonda LABOR, PA  guaiFENesin -codeine  100-10 MG/5ML syrup Take 5 mLs by mouth 3 (three) times daily as needed for cough. 07/30/23   Elnor Jayson LABOR, DO  ibuprofen  (ADVIL ) 600 MG tablet Take 1 tablet (600 mg total) by mouth every 6 (six) hours as needed. 01/31/23   Silver Wonda LABOR, PA      Allergies    Metronidazole     Review of Systems   Review of Systems  Constitutional: Negative.   HENT:  Negative.    Respiratory: Negative.    Cardiovascular: Negative.   Gastrointestinal:  Positive for abdominal pain (resolved), nausea and vomiting. Negative for abdominal distention, anal bleeding, blood in stool, constipation, diarrhea and rectal pain.  Genitourinary: Negative.   Musculoskeletal: Negative.   Skin: Negative.   Neurological: Negative.   All other systems reviewed and are negative.   Physical Exam Updated Vital Signs BP 116/83 (BP Location: Left Arm)   Pulse 80   Temp 98.5 F (36.9 C) (Oral)   Resp 16   LMP 06/04/2023 (Approximate)   SpO2 100%  Physical Exam Vitals and nursing note reviewed.  Constitutional:      General: She is not in acute distress.    Appearance: She is well-developed. She is not ill-appearing, toxic-appearing or diaphoretic.  HENT:     Head: Atraumatic.  Eyes:     Pupils: Pupils are equal, round, and reactive to light.  Cardiovascular:     Rate and Rhythm: Normal rate.  Pulmonary:     Effort: Pulmonary effort is normal. No respiratory distress.     Breath sounds: Normal breath sounds.  Abdominal:     General: Bowel sounds are normal. There is no distension.     Palpations: Abdomen is soft. There is no mass.     Tenderness: There is no abdominal tenderness. There is no right CVA tenderness, left CVA tenderness, guarding or rebound.  Hernia: No hernia is present.  Musculoskeletal:        General: Normal range of motion.     Cervical back: Normal range of motion.  Skin:    General: Skin is warm and dry.     Capillary Refill: Capillary refill takes less than 2 seconds.  Neurological:     General: No focal deficit present.     Mental Status: She is alert.  Psychiatric:        Mood and Affect: Mood normal.    ED Results / Procedures / Treatments   Labs (all labs ordered are listed, but only abnormal results are displayed) Labs Reviewed  URINALYSIS, ROUTINE W REFLEX MICROSCOPIC - Abnormal; Notable for the following components:       Result Value   Protein, ur 30 (*)    All other components within normal limits  CBC WITH DIFFERENTIAL/PLATELET - Abnormal; Notable for the following components:   RDW 11.4 (*)    All other components within normal limits  COMPREHENSIVE METABOLIC PANEL - Abnormal; Notable for the following components:   Sodium 134 (*)    Potassium 3.2 (*)    CO2 20 (*)    All other components within normal limits  URINALYSIS, MICROSCOPIC (REFLEX) - Abnormal; Notable for the following components:   Bacteria, UA FEW (*)    All other components within normal limits  LIPASE, BLOOD    EKG None  Radiology No results found.  Procedures Ultrasound ED OB Pelvic  Date/Time: 09/08/2023 10:19 PM  Performed by: Edie Einstein A, PA-C Authorized by: Edie Einstein LABOR, PA-C   Procedure details:    Indications: evaluate for IUP     Assess:  Intrauterine pregnancy   Technique:  Transabdominal obstetric (HCG+) exam   Images: archived   Study Limitations: bowel gas Uterine findings:    Intrauterine pregnancy: identified     Single gestation: identified     Fetal heart rate: identified     Estimated gestational age: 25 weeks Left ovary findings:    Left ovary:  Unable to visualize    Right ovary findings:     Right ovary:  Unable to visualize    Comments:     IUP FHT 135     Medications Ordered in ED Medications  sodium chloride  0.9 % bolus 1,000 mL (1,000 mLs Intravenous New Bag/Given 09/08/23 2220)  ondansetron  (ZOFRAN ) injection 4 mg (4 mg Intravenous Given 09/08/23 2219)    ED Course/ Medical Decision Making/ A&P   33 year old G3, P0 approximately 13 weeks 5 days pregnant here for evaluation of persistent nausea and vomiting.  Typically takes Unisom and B6 which helps however today has not.  Had some abdominal cramping earlier this morning which self resolved.  No urinary symptoms.  No change in bowel movements.  Has not been seen by OB/GYN yet has not had ultrasound to establish IUP.  Will  plan on labs, IV fluids, antiemetics as well as bedside ultrasound  Official ultrasound not available in ED.  Bedside ultrasound performed by myself shows approximately 13-week fetus heart rate 135, IUP  Labs personally viewed and interpreted:  CBC without leukocytosis  Metabolic panel potassium 3.2 Lipase 23 UA negative for infection   Patient reassessed.  Feels improved, tolerating p.o. intake.  No emesis here in the emergency department.  I recommended close follow-up with her OB/GYN.  Recommend continuing on her Unisom and B6 using Zofran  as second line if she continues to have nausea and vomiting.  Gradually increase diet,  push Gatorade Pedialyte  Return for new or worsening symptoms  The patient has been appropriately medically screened and/or stabilized in the ED. I have low suspicion for any other emergent medical condition which would require further screening, evaluation or treatment in the ED or require inpatient management.  Patient is hemodynamically stable and in no acute distress.  Patient able to ambulate in department prior to ED.  Evaluation does not show acute pathology that would require ongoing or additional emergent interventions while in the emergency department or further inpatient treatment.  I have discussed the diagnosis with the patient and answered all questions.  Pain is been managed while in the emergency department and patient has no further complaints prior to discharge.  Patient is comfortable with plan discussed in room and is stable for discharge at this time.  I have discussed strict return precautions for returning to the emergency department.  Patient was encouraged to follow-up with PCP/specialist refer to at discharge.                                Medical Decision Making Amount and/or Complexity of Data Reviewed Independent Historian: friend External Data Reviewed: labs, radiology and notes. Labs: ordered. Decision-making details documented in ED  Course. Radiology: ordered and independent interpretation performed. Decision-making details documented in ED Course.    Details: Bedside US  performed by  myself  Risk Prescription drug management. Decision regarding hospitalization. Diagnosis or treatment significantly limited by social determinants of health.         Final Clinical Impression(s) / ED Diagnoses Final diagnoses:  Nausea and vomiting in pregnancy  Hypokalemia    Rx / DC Orders ED Discharge Orders          Ordered    ondansetron  (ZOFRAN -ODT) 4 MG disintegrating tablet  Every 8 hours PRN        09/08/23 2300              Schwanda Zima A, PA-C 09/08/23 2300    Curatolo, Adam, DO 09/08/23 2313

## 2023-09-08 NOTE — ED Notes (Signed)
 Reviewed D/C information with the patient, pt verbalized understanding. No additional concerns at this time.

## 2023-09-08 NOTE — Discharge Instructions (Addendum)
 Given you Zofran , take as needed for nausea and vomiting, uses sporadically only when the Unisom and B6 is not working.  Potassium level was mildly low this is likely due to your vomiting.  Recommend increasing potassium rich foods.  There is a paper in your discharge instructions at his over certain foods.  You are not already taking a prenatal vitamin please start.  Keep your follow-up appointment with OB/GYN

## 2023-09-08 NOTE — ED Notes (Signed)
 Pt provided ginger ale for PO challenge

## 2023-09-19 ENCOUNTER — Emergency Department (HOSPITAL_BASED_OUTPATIENT_CLINIC_OR_DEPARTMENT_OTHER)
Admission: EM | Admit: 2023-09-19 | Discharge: 2023-09-19 | Disposition: A | Discharge status: Home / Self Care | Payer: Managed Care, Other (non HMO) | Attending: Emergency Medicine | Admitting: Emergency Medicine

## 2023-09-19 ENCOUNTER — Other Ambulatory Visit: Payer: Self-pay

## 2023-09-19 ENCOUNTER — Encounter (HOSPITAL_BASED_OUTPATIENT_CLINIC_OR_DEPARTMENT_OTHER): Payer: Self-pay | Admitting: Emergency Medicine

## 2023-09-19 DIAGNOSIS — E860 Dehydration: Secondary | ICD-10-CM | POA: Insufficient documentation

## 2023-09-19 DIAGNOSIS — O99281 Endocrine, nutritional and metabolic diseases complicating pregnancy, first trimester: Secondary | ICD-10-CM | POA: Insufficient documentation

## 2023-09-19 DIAGNOSIS — Z20822 Contact with and (suspected) exposure to covid-19: Secondary | ICD-10-CM | POA: Diagnosis not present

## 2023-09-19 DIAGNOSIS — R000 Tachycardia, unspecified: Secondary | ICD-10-CM | POA: Insufficient documentation

## 2023-09-19 DIAGNOSIS — J45909 Unspecified asthma, uncomplicated: Secondary | ICD-10-CM | POA: Insufficient documentation

## 2023-09-19 DIAGNOSIS — E876 Hypokalemia: Secondary | ICD-10-CM | POA: Insufficient documentation

## 2023-09-19 DIAGNOSIS — Z3A12 12 weeks gestation of pregnancy: Secondary | ICD-10-CM | POA: Diagnosis not present

## 2023-09-19 DIAGNOSIS — O99511 Diseases of the respiratory system complicating pregnancy, first trimester: Secondary | ICD-10-CM | POA: Insufficient documentation

## 2023-09-19 DIAGNOSIS — O219 Vomiting of pregnancy, unspecified: Secondary | ICD-10-CM | POA: Insufficient documentation

## 2023-09-19 LAB — CBC WITH DIFFERENTIAL/PLATELET
Abs Immature Granulocytes: 0.01 10*3/uL (ref 0.00–0.07)
Basophils Absolute: 0.1 10*3/uL (ref 0.0–0.1)
Basophils Relative: 1 %
Eosinophils Absolute: 0.1 10*3/uL (ref 0.0–0.5)
Eosinophils Relative: 1 %
HCT: 37.5 % (ref 36.0–46.0)
Hemoglobin: 13.2 g/dL (ref 12.0–15.0)
Immature Granulocytes: 0 %
Lymphocytes Relative: 27 %
Lymphs Abs: 2.1 10*3/uL (ref 0.7–4.0)
MCH: 29.9 pg (ref 26.0–34.0)
MCHC: 35.2 g/dL (ref 30.0–36.0)
MCV: 84.8 fL (ref 80.0–100.0)
Monocytes Absolute: 0.7 10*3/uL (ref 0.1–1.0)
Monocytes Relative: 8 %
Neutro Abs: 4.9 10*3/uL (ref 1.7–7.7)
Neutrophils Relative %: 63 %
Platelets: 376 10*3/uL (ref 150–400)
RBC: 4.42 MIL/uL (ref 3.87–5.11)
RDW: 10.9 % — ABNORMAL LOW (ref 11.5–15.5)
WBC: 7.8 10*3/uL (ref 4.0–10.5)
nRBC: 0 % (ref 0.0–0.2)

## 2023-09-19 LAB — URINALYSIS, ROUTINE W REFLEX MICROSCOPIC
Glucose, UA: NEGATIVE mg/dL
Hgb urine dipstick: NEGATIVE
Ketones, ur: 40 mg/dL — AB
Leukocytes,Ua: NEGATIVE
Nitrite: NEGATIVE
Protein, ur: 30 mg/dL — AB
Specific Gravity, Urine: >=1.03 (ref 1.005–1.030)
pH: 5.5 (ref 5.0–8.0)

## 2023-09-19 LAB — COMPREHENSIVE METABOLIC PANEL
ALT: 16 U/L (ref 0–44)
AST: 18 U/L (ref 15–41)
Albumin: 4.1 g/dL (ref 3.5–5.0)
Alkaline Phosphatase: 47 U/L (ref 38–126)
Anion gap: 10 (ref 5–15)
BUN: 7 mg/dL (ref 6–20)
CO2: 21 mmol/L — ABNORMAL LOW (ref 22–32)
Calcium: 9.3 mg/dL (ref 8.9–10.3)
Chloride: 103 mmol/L (ref 98–111)
Creatinine, Ser: 0.62 mg/dL (ref 0.44–1.00)
GFR, Estimated: >60 mL/min (ref >60)
Glucose, Bld: 115 mg/dL — ABNORMAL HIGH (ref 70–99)
Potassium: 3.2 mmol/L — ABNORMAL LOW (ref 3.5–5.1)
Sodium: 134 mmol/L — ABNORMAL LOW (ref 135–145)
Total Bilirubin: 1.4 mg/dL — ABNORMAL HIGH (ref 0.0–1.2)
Total Protein: 7.3 g/dL (ref 6.5–8.1)

## 2023-09-19 LAB — RESP PANEL BY RT-PCR (RSV, FLU A&B, COVID)  RVPGX2
Influenza A by PCR: NEGATIVE
Influenza B by PCR: NEGATIVE
Resp Syncytial Virus by PCR: NEGATIVE
SARS Coronavirus 2 by RT PCR: NEGATIVE

## 2023-09-19 LAB — URINALYSIS, MICROSCOPIC (REFLEX)
RBC / HPF: NONE SEEN RBC/hpf (ref 0–5)
WBC, UA: NONE SEEN WBC/hpf (ref 0–5)

## 2023-09-19 LAB — MAGNESIUM: Magnesium: 1.4 mg/dL — ABNORMAL LOW (ref 1.7–2.4)

## 2023-09-19 LAB — HCG, QUANTITATIVE, PREGNANCY: hCG, Beta Chain, Quant, S: >250000 m[IU]/mL — ABNORMAL HIGH (ref <5)

## 2023-09-19 MED ORDER — MAGNESIUM SULFATE 2 GM/50ML IV SOLN
2.0000 g | Freq: Once | INTRAVENOUS | Status: AC
  Administered 2023-09-19: 2 g via INTRAVENOUS
  Filled 2023-09-19: qty 50

## 2023-09-19 MED ORDER — ONDANSETRON HCL 4 MG/2ML IJ SOLN
4.0000 mg | Freq: Once | INTRAMUSCULAR | Status: AC
  Administered 2023-09-19: 4 mg via INTRAVENOUS
  Filled 2023-09-19: qty 2

## 2023-09-19 MED ORDER — SODIUM CHLORIDE 0.9 % IV BOLUS
1000.0000 mL | Freq: Once | INTRAVENOUS | Status: AC
  Administered 2023-09-19: 1000 mL via INTRAVENOUS

## 2023-09-19 MED ORDER — POTASSIUM CHLORIDE 10 MEQ/100ML IV SOLN
10.0000 meq | Freq: Once | INTRAVENOUS | Status: AC
  Administered 2023-09-19: 10 meq via INTRAVENOUS
  Filled 2023-09-19: qty 100

## 2023-09-19 NOTE — ED Notes (Signed)
 Pt alert and oriented X 4 at the time of discharge. RR even and unlabored. No acute distress noted. Pt verbalized understanding of discharge instructions as discussed. Pt ambulatory to lobby at time of discharge.

## 2023-09-19 NOTE — ED Provider Notes (Signed)
 [place] [facility] Provider Note   CSN: [id number] Arrival date & time: 09/19/23  1258     History  Chief Complaint  Patient presents with   Emesis During Pregnancy    Alexis Peters is a 33 y.o. female.  Pt is a 33 yo female with pmhx significant for asthma and current pregnancy.  Pt said she is nearly [redacted] weeks pregnant and started vomiting this morning.  She took a zofran , but threw up as soon as it touched her tongue.  She has not been able to keep down any fluids.  She feels dizzy.  No fevers.  No pain.  No vaginal bleeding.  She has seen obgyn and had an us  on Monday, 1/13 which showed an IUP with HB.  She is O- blood type.       Home Medications Prior to Admission medications   Medication Sig Start Date End Date Taking? Authorizing Provider  albuterol  (VENTOLIN  HFA) 108 (90 Base) MCG/ACT inhaler Inhale 1-2 puffs into the lungs every 6 (six) hours as needed for wheezing or shortness of breath. 09/02/22   Lalla Pill, PA-C  cetirizine  (ZYRTEC  ALLERGY) 10 MG tablet Take 1 tablet (10 mg total) by mouth daily for 14 days. 07/30/23 08/13/23  Teddi Favors, DO  cyclobenzaprine  (FLEXERIL ) 10 MG tablet Take 1 tablet (10 mg total) by mouth 2 (two) times daily as needed for muscle spasms. 01/31/23   Neil Balls A, PA  guaiFENesin -codeine  100-10 MG/5ML syrup Take 5 mLs by mouth 3 (three) times daily as needed for cough. 07/30/23   Teddi Favors, DO  ibuprofen  (ADVIL ) 600 MG tablet Take 1 tablet (600 mg total) by mouth every 6 (six) hours as needed. 01/31/23   [place] Butter, PA  ondansetron  (ZOFRAN -ODT) 4 MG disintegrating tablet Take 1 tablet (4 mg total) by mouth every 8 (eight) hours as needed. 09/08/23   Henderly, Britni A, PA-C      Allergies    Metronidazole     Review of Systems   Review of Systems  Gastrointestinal:  Positive for nausea and vomiting.  All other systems reviewed and are negative.   Physical Exam Updated Vital  Signs BP 119/75   Pulse 87   Temp 98.4 F (36.9 C) (Oral)   Resp 15   Ht 5' (1.524 m)   Wt 46.7 kg   LMP 06/04/2023 (Approximate)   SpO2 100%   BMI 20.12 kg/m  Physical Exam Vitals and nursing note reviewed.  Constitutional:      Appearance: Normal appearance.  HENT:     Head: Normocephalic and atraumatic.     Right Ear: External ear normal.     Left Ear: External ear normal.     Nose: Nose normal.     Mouth/Throat:     Mouth: Mucous membranes are dry.  Eyes:     Extraocular Movements: Extraocular movements intact.     Conjunctiva/sclera: Conjunctivae normal.     Pupils: Pupils are equal, round, and reactive to light.  Cardiovascular:     Rate and Rhythm: Regular rhythm. Tachycardia present.     Pulses: Normal pulses.     Heart sounds: Normal heart sounds.  Pulmonary:     Effort: Pulmonary effort is normal.     Breath sounds: Normal breath sounds.  Abdominal:     General: Abdomen is flat. Bowel sounds are normal.     Palpations: Abdomen is soft.  Musculoskeletal:  General: Normal range of motion.     Cervical back: Normal range of motion and neck supple.  Skin:    General: Skin is warm.     Capillary Refill: Capillary refill takes less than 2 seconds.  Neurological:     General: No focal deficit present.     Mental Status: She is alert and oriented to person, place, and time.  Psychiatric:        Mood and Affect: Mood normal.        Behavior: Behavior normal.     ED Results / Procedures / Treatments   Labs (all labs ordered are listed, but only abnormal results are displayed) Labs Reviewed  COMPREHENSIVE METABOLIC PANEL - Abnormal; Notable for the following components:      Result Value   Sodium 134 (*)    Potassium 3.2 (*)    CO2 21 (*)    Glucose, Bld 115 (*)    Total Bilirubin 1.4 (*)    All other components within normal limits  CBC WITH DIFFERENTIAL/PLATELET - Abnormal; Notable for the following components:   RDW 10.9 (*)    All other  components within normal limits  URINALYSIS, ROUTINE W REFLEX MICROSCOPIC - Abnormal; Notable for the following components:   Bilirubin Urine SMALL (*)    Ketones, ur 40 (*)    Protein, ur 30 (*)    All other components within normal limits  URINALYSIS, MICROSCOPIC (REFLEX) - Abnormal; Notable for the following components:   Bacteria, UA RARE (*)    All other components within normal limits  HCG, QUANTITATIVE, PREGNANCY - Abnormal; Notable for the following components:   hCG, Beta Chain, Quant, S >250,000 (*)    All other components within normal limits  MAGNESIUM  - Abnormal; Notable for the following components:   Magnesium  1.4 (*)    All other components within normal limits  RESP PANEL BY RT-PCR (RSV, FLU A&B, COVID)  RVPGX2  URINE CULTURE    EKG None  Radiology No results found.  Procedures Procedures    Medications Ordered in ED Medications  magnesium  sulfate IVPB 2 g 50 mL (2 g Intravenous New Bag/Given 09/19/23 1738)  sodium chloride  0.9 % bolus 1,000 mL (0 mLs Intravenous Stopped 09/19/23 1706)  ondansetron  (ZOFRAN ) injection 4 mg (4 mg Intravenous Given 09/19/23 1604)  potassium chloride  10 mEq in 100 mL IVPB (10 mEq Intravenous New Bag/Given 09/19/23 1648)    ED Course/ Medical Decision Making/ A&P                                 Medical Decision Making Amount and/or Complexity of Data Reviewed Labs: ordered.  Risk Prescription drug management.   This patient presents to the ED for concern of n/v, this involves an extensive number of treatment options, and is a complaint that carries with it a high risk of complications and morbidity.  The differential diagnosis includes infection, hyperemesis gravidarum, n/v in preg, electrolyte abn   Co morbidities that complicate the patient evaluation  Asthma, pregnancy   Additional history obtained:  Additional history obtained from epic chart review  Lab Tests:  I Ordered, and personally interpreted labs.   The pertinent results include:  ua + ketones, protein (no infection), cbc nl, cmp with k low at 3.2  Cardiac Monitoring:  The patient was maintained on a cardiac monitor.  I personally viewed and interpreted the cardiac monitored which showed an underlying rhythm  of: nsr   Medicines ordered and prescription drug management:  I ordered medication including ivfs/zofran   for sx  Reevaluation of the patient after these medicines showed that the patient improved I have reviewed the patients home medicines and have made adjustments as needed   Critical Interventions:  ivfs   Problem List / ED Course:  N/v and dehydration:  pt is feeling much better after fluids.  She is tolerating soda.  She does not want a rx for a suppository.  Hypomagnesemia/hypokalemia:  pt given iv kcl and iv mg.  She is give a list of foods high in each.     Reevaluation:  After the interventions noted above, I reevaluated the patient and found that they have :improved   Social Determinants of Health:  Lives at home   Dispostion:  After consideration of the diagnostic results and the patients response to treatment, I feel that the patent would benefit from discharge with outpatient f/u.  She knows to return if worse. F/u with obgyn.        Final Clinical Impression(s) / ED Diagnoses Final diagnoses:  Hypokalemia  Hypomagnesemia  [redacted] weeks gestation of pregnancy  Dehydration  Nausea and vomiting during pregnancy    Rx / DC Orders ED Discharge Orders     None         Sueellen Emery, MD 09/19/23 1824

## 2023-09-19 NOTE — ED Notes (Signed)
 Nurse was called out to waiting area because pt stated she felt like she was going to pass out; pt ambulatory to triage room to recheck VS; Skin PWD, VSS; this was relayed to pt and reassured her we are working to get rooms; pt then sts, "Y'all brought back 2 other people already." Triage and acuity system explained to pt, pt verbalizes understanding.

## 2023-09-19 NOTE — Discharge Instructions (Addendum)
 Foods high in potassium and in magnesium : Leafy green vegetables: Spinach, kale, and collard greens are good sources of calcium and magnesium . Other vegetables: Avocado, sweet potato, and squash are rich in potassium. Potatoes are a good source of phosphorous and magnesium , and, with their skin on, they are also high in potassium

## 2023-09-19 NOTE — ED Triage Notes (Signed)
 Pt is pregnant (11wk 5d) and unable to control vomiting with Zofran  or B6/Unisom

## 2023-09-20 LAB — URINE CULTURE: Culture: NO GROWTH

## 2023-10-08 ENCOUNTER — Emergency Department (HOSPITAL_BASED_OUTPATIENT_CLINIC_OR_DEPARTMENT_OTHER)
Admission: EM | Admit: 2023-10-08 | Discharge: 2023-10-08 | Disposition: A | Discharge status: Home / Self Care | Payer: Managed Care, Other (non HMO) | Attending: Emergency Medicine | Admitting: Emergency Medicine

## 2023-10-08 ENCOUNTER — Other Ambulatory Visit: Payer: Self-pay

## 2023-10-08 ENCOUNTER — Encounter (HOSPITAL_BASED_OUTPATIENT_CLINIC_OR_DEPARTMENT_OTHER): Payer: Self-pay | Admitting: Emergency Medicine

## 2023-10-08 DIAGNOSIS — Z3A14 14 weeks gestation of pregnancy: Secondary | ICD-10-CM | POA: Insufficient documentation

## 2023-10-08 DIAGNOSIS — O26891 Other specified pregnancy related conditions, first trimester: Secondary | ICD-10-CM | POA: Insufficient documentation

## 2023-10-08 DIAGNOSIS — R102 Pelvic and perineal pain: Secondary | ICD-10-CM

## 2023-10-08 HISTORY — DX: Sickle-cell disease without crisis: D57.1

## 2023-10-08 LAB — CBC WITH DIFFERENTIAL/PLATELET
Abs Immature Granulocytes: 0.03 10*3/uL (ref 0.00–0.07)
Basophils Absolute: 0.1 10*3/uL (ref 0.0–0.1)
Basophils Relative: 1 %
Eosinophils Absolute: 0.2 10*3/uL (ref 0.0–0.5)
Eosinophils Relative: 2 %
HCT: 32.7 % — ABNORMAL LOW (ref 36.0–46.0)
Hemoglobin: 11.4 g/dL — ABNORMAL LOW (ref 12.0–15.0)
Immature Granulocytes: 0 %
Lymphocytes Relative: 22 %
Lymphs Abs: 2.1 10*3/uL (ref 0.7–4.0)
MCH: 28.9 pg (ref 26.0–34.0)
MCHC: 34.9 g/dL (ref 30.0–36.0)
MCV: 83 fL (ref 80.0–100.0)
Monocytes Absolute: 0.7 10*3/uL (ref 0.1–1.0)
Monocytes Relative: 7 %
Neutro Abs: 6.7 10*3/uL (ref 1.7–7.7)
Neutrophils Relative %: 68 %
Platelets: 327 10*3/uL (ref 150–400)
RBC: 3.94 MIL/uL (ref 3.87–5.11)
RDW: 11.2 % — ABNORMAL LOW (ref 11.5–15.5)
WBC: 9.8 10*3/uL (ref 4.0–10.5)
nRBC: 0 % (ref 0.0–0.2)

## 2023-10-08 LAB — URINALYSIS, ROUTINE W REFLEX MICROSCOPIC
Bilirubin Urine: NEGATIVE
Glucose, UA: NEGATIVE mg/dL
Hgb urine dipstick: NEGATIVE
Ketones, ur: NEGATIVE mg/dL
Leukocytes,Ua: NEGATIVE
Nitrite: NEGATIVE
Protein, ur: NEGATIVE mg/dL
Specific Gravity, Urine: 1.02 (ref 1.005–1.030)
pH: 6 (ref 5.0–8.0)

## 2023-10-08 LAB — COMPREHENSIVE METABOLIC PANEL
ALT: 11 U/L (ref 0–44)
AST: 13 U/L — ABNORMAL LOW (ref 15–41)
Albumin: 3.3 g/dL — ABNORMAL LOW (ref 3.5–5.0)
Alkaline Phosphatase: 41 U/L (ref 38–126)
Anion gap: 6 (ref 5–15)
BUN: 8 mg/dL (ref 6–20)
CO2: 23 mmol/L (ref 22–32)
Calcium: 8.8 mg/dL — ABNORMAL LOW (ref 8.9–10.3)
Chloride: 106 mmol/L (ref 98–111)
Creatinine, Ser: 0.57 mg/dL (ref 0.44–1.00)
GFR, Estimated: >60 mL/min (ref >60)
Glucose, Bld: 86 mg/dL (ref 70–99)
Potassium: 3.3 mmol/L — ABNORMAL LOW (ref 3.5–5.1)
Sodium: 135 mmol/L (ref 135–145)
Total Bilirubin: 0.7 mg/dL (ref 0.0–1.2)
Total Protein: 6.6 g/dL (ref 6.5–8.1)

## 2023-10-08 LAB — LIPASE, BLOOD: Lipase: 28 U/L (ref 11–51)

## 2023-10-08 MED ORDER — ACETAMINOPHEN 500 MG PO TABS
1000.0000 mg | ORAL_TABLET | Freq: Once | ORAL | Status: AC
  Administered 2023-10-08: 1000 mg via ORAL
  Filled 2023-10-08: qty 2

## 2023-10-08 MED ORDER — ONDANSETRON 4 MG PO TBDP
8.0000 mg | ORAL_TABLET | Freq: Once | ORAL | Status: AC
  Administered 2023-10-08: 8 mg via ORAL
  Filled 2023-10-08: qty 2

## 2023-10-08 MED ORDER — METRONIDAZOLE 0.75 % VA GEL: 1.0000 | Freq: Two times a day (BID) | VAGINAL | 0 refills | Status: AC

## 2023-10-08 NOTE — Discharge Instructions (Signed)
Begin using MetroGel as prescribed.  Follow-up with your GYN later this week for a recheck, and return to the ER if symptoms significantly worsen or change.

## 2023-10-08 NOTE — ED Triage Notes (Signed)
Pt reports being [redacted] weeks pregnant and c/o constant abd pain since 1900-2000 last night. She is unsure of fetal movement. FHT done in triage with FHR 155 in the mid lower abd. G1P0. Pt has had US done at [facility] office with IUP confirmed. She mentions being told this pregnancy that she has sickle cell.

## 2023-10-08 NOTE — ED Provider Notes (Addendum)
[place] [facility] [place] Provider Note   CSN: [id number] Arrival date & time: 10/08/23  0413     History  No chief complaint on file.   Alexis Peters is a 33 y.o. female.  Patient is a 33 year old female G1 at approximately [redacted] weeks gestation.  Patient presenting today with complaints of lower abdominal pain.  This started earlier this evening in the absence of any injury or trauma.  She describes pain across the suprapubic region that is constant.  She denies any bleeding.  No discharge.  No fevers or chills.  No urinary complaints.  The history is provided by the patient.       Home Medications Prior to Admission medications   Medication Sig Start Date End Date Taking? Authorizing Provider  albuterol (VENTOLIN HFA) 108 (90 Base) MCG/ACT inhaler Inhale 1-2 puffs into the lungs every 6 (six) hours as needed for wheezing or shortness of breath. 09/02/22   Cristopher Peru, PA-C  cetirizine (ZYRTEC ALLERGY) 10 MG tablet Take 1 tablet (10 mg total) by mouth daily for 14 days. 07/30/23 08/13/23  Sloan Leiter, DO  cyclobenzaprine (FLEXERIL) 10 MG tablet Take 1 tablet (10 mg total) by mouth 2 (two) times daily as needed for muscle spasms. 01/31/23   Sherian Maroon A, PA  guaiFENesin-codeine 100-10 MG/5ML syrup Take 5 mLs by mouth 3 (three) times daily as needed for cough. 07/30/23   Sloan Leiter, DO  ibuprofen (ADVIL) 600 MG tablet Take 1 tablet (600 mg total) by mouth every 6 (six) hours as needed. 01/31/23   Sherian Maroon A, PA  ondansetron (ZOFRAN-ODT) 4 MG disintegrating tablet Take 1 tablet (4 mg total) by mouth every 8 (eight) hours as needed. 09/08/23   Henderly, Britni A, PA-C      Allergies    Metronidazole    Review of Systems   Review of Systems  All other systems reviewed and are negative.   Physical Exam Updated Vital Signs BP 132/75 (BP Location: Right Arm)   Pulse 100   Resp 20   Ht 5' (1.524 m)   Wt 48.1 kg   LMP 06/04/2023  (Approximate)   SpO2 100%   BMI 20.70 kg/m  Physical Exam Vitals and nursing note reviewed.  Constitutional:      General: She is not in acute distress.    Appearance: She is well-developed. She is not diaphoretic.  HENT:     Head: Normocephalic and atraumatic.  Cardiovascular:     Rate and Rhythm: Normal rate and regular rhythm.     Heart sounds: No murmur heard.    No friction rub. No gallop.  Pulmonary:     Effort: Pulmonary effort is normal. No respiratory distress.     Breath sounds: Normal breath sounds. No wheezing.  Abdominal:     General: Bowel sounds are normal. There is no distension.     Palpations: Abdomen is soft.     Tenderness: There is abdominal tenderness. There is no right CVA tenderness, left CVA tenderness, guarding or rebound.     Comments: There is tenderness to palpation in the suprapubic region.  Musculoskeletal:        General: Normal range of motion.     Cervical back: Normal range of motion and neck supple.  Skin:    General: Skin is warm and dry.  Neurological:     General: No focal deficit present.     Mental Status: She is alert and oriented to  person, place, and time.     ED Results / Procedures / Treatments   Labs (all labs ordered are listed, but only abnormal results are displayed) Labs Reviewed  COMPREHENSIVE METABOLIC PANEL  LIPASE, BLOOD  CBC WITH DIFFERENTIAL/PLATELET  URINALYSIS, ROUTINE W REFLEX MICROSCOPIC    EKG None  Radiology No results found.  Procedures Procedures   Bedside ultrasound performed showing good fetal movement with fetal heart tones in the 130s.  Medications Ordered in ED Medications  acetaminophen (TYLENOL) tablet 1,000 mg (has no administration in time range)    ED Course/ Medical Decision Making/ A&P  Patient is a 33 year old female presenting with lower abdominal pain.  She is G1 at approximately [redacted] weeks gestation.  She is not experiencing any bleeding.  Patient arrives here with stable vital  signs and is afebrile.  There is mild tenderness across the suprapubic region, but no peritoneal signs.  Laboratory studies obtained including CBC, CMP, and lipase, all of which are unremarkable.  There is no leukocytosis, no elevation of liver or pancreatic enzymes, and no electrolyte derangement.  Urinalysis is clear and inconsistent with infection.  I did perform a bedside ultrasound which showed an intrauterine pregnancy.  There was good fetal movement with fetal heart rate in the 130s.  Patient was given a gram of Tylenol here in the ER and appears much more comfortable.  At this point, cause of her abdominal pain is unclear, but could be related to round ligament pain as I have found no other explanation.  Patient to be discharged with Tylenol and follow-up with her GYN.  Also, patient diagnosed with BV 2 weeks ago, but was unable to fill her prescription for the MetroGel.  She has requesting a prescription for this.  Final Clinical Impression(s) / ED Diagnoses Final diagnoses:  None    Rx / DC Orders ED Discharge Orders     None         Geoffery Lyons, MD 10/08/23 6578    Geoffery Lyons, MD 10/08/23 [phone]
